# Patient Record
Sex: Female | Born: 1975
Health system: Southern US, Community
[De-identification: ages and names within clinical notes are randomized; demographics above are authoritative.]

## PROBLEM LIST (undated history)

## (undated) DIAGNOSIS — K589 Irritable bowel syndrome without diarrhea: Secondary | ICD-10-CM

## (undated) DIAGNOSIS — R87619 Unspecified abnormal cytological findings in specimens from cervix uteri: Secondary | ICD-10-CM

## (undated) DIAGNOSIS — G43009 Migraine without aura, not intractable, without status migrainosus: Secondary | ICD-10-CM

## (undated) DIAGNOSIS — I10 Essential (primary) hypertension: Secondary | ICD-10-CM

## (undated) DIAGNOSIS — K219 Gastro-esophageal reflux disease without esophagitis: Secondary | ICD-10-CM

## (undated) DIAGNOSIS — M519 Unspecified thoracic, thoracolumbar and lumbosacral intervertebral disc disorder: Secondary | ICD-10-CM

## (undated) DIAGNOSIS — B029 Zoster without complications: Secondary | ICD-10-CM

## (undated) DIAGNOSIS — E538 Deficiency of other specified B group vitamins: Secondary | ICD-10-CM

## (undated) HISTORY — DX: Deficiency of other specified B group vitamins: E53.8

## (undated) HISTORY — DX: Unspecified thoracic, thoracolumbar and lumbosacral intervertebral disc disorder: M51.9

## (undated) HISTORY — DX: Essential (primary) hypertension: I10

## (undated) HISTORY — DX: Unspecified abnormal cytological findings in specimens from cervix uteri: R87.619

## (undated) HISTORY — DX: Zoster without complications: B02.9

## (undated) HISTORY — DX: Migraine without aura, not intractable, without status migrainosus: G43.009

## (undated) HISTORY — DX: Irritable bowel syndrome, unspecified: K58.9

---

## 1999-05-10 ENCOUNTER — Encounter: Payer: Self-pay | Admitting: *Deleted

## 1999-05-10 ENCOUNTER — Ambulatory Visit (HOSPITAL_COMMUNITY): Admission: RE | Admit: 1999-05-10 | Discharge: 1999-05-10 | Payer: Self-pay | Admitting: *Deleted

## 2001-06-05 ENCOUNTER — Encounter: Payer: Self-pay | Admitting: *Deleted

## 2001-06-05 ENCOUNTER — Ambulatory Visit (HOSPITAL_COMMUNITY): Admission: RE | Admit: 2001-06-05 | Discharge: 2001-06-05 | Payer: Self-pay | Admitting: *Deleted

## 2002-07-29 ENCOUNTER — Ambulatory Visit (HOSPITAL_COMMUNITY): Admission: RE | Admit: 2002-07-29 | Discharge: 2002-07-29 | Payer: Self-pay | Admitting: *Deleted

## 2002-07-29 ENCOUNTER — Encounter: Payer: Self-pay | Admitting: *Deleted

## 2004-03-31 ENCOUNTER — Encounter: Admission: RE | Admit: 2004-03-31 | Discharge: 2004-05-03 | Payer: Self-pay | Admitting: Sports Medicine

## 2006-06-21 ENCOUNTER — Inpatient Hospital Stay (HOSPITAL_COMMUNITY): Admission: AD | Admit: 2006-06-21 | Discharge: 2006-06-24 | Payer: Self-pay | Admitting: Obstetrics & Gynecology

## 2008-04-28 ENCOUNTER — Ambulatory Visit (HOSPITAL_BASED_OUTPATIENT_CLINIC_OR_DEPARTMENT_OTHER): Admission: RE | Admit: 2008-04-28 | Discharge: 2008-04-28 | Payer: Self-pay | Admitting: Orthopedic Surgery

## 2008-04-28 HISTORY — PX: OSTEOTOMY AND ULNAR SHORTENING: SHX2140

## 2010-07-12 LAB — POCT HEMOGLOBIN-HEMACUE: Hemoglobin: 12.8 g/dL (ref 12.0–15.0)

## 2010-08-09 NOTE — Op Note (Signed)
NAMEKETINA, MARS NO.:  [id number]   MEDICAL RECORD NO.:  [id number]          PATIENT TYPE:  AMB   LOCATION:  DSC                          FACILITY:  [facility]   PHYSICIAN:  Katy Fitch. Sypher, M.D. DATE OF BIRTH:  April 03, 1975   DATE OF PROCEDURE:  04/28/2008  DATE OF DISCHARGE:                               OPERATIVE REPORT   PREOPERATIVE DIAGNOSIS:  Traumatic ulnocarpal abutment with an 80-month  history of left ulnar-sided wrist pain following a fall in March 2009  with plain films documenting an ulnar positive variant and an MRI  documenting extensive lunate edema and hyaline cartilage injuries on the  ulnar aspect of the lunate due to ulnocarpal abutment.   OPERATION:  1. Examination of left wrist under anesthesia.  2. Diagnostic arthroscopy of the left wrist followed by arthroscopic      debridement of lunate chondromalacia, triangular fibrocartilage      debris and synovectomy.  3. Precision ulnar shortening with a 2.5-mm bone wafer resection at      the diaphyseal-metaphyseal junction distally and placement of a 6-      hole 2.4 mm ASIF stainless steel locking plate system with hybrid      fixation with both gliding and locking screws.   SURGEON:  Katy Fitch. Sypher, MD   ASSISTANT:  Annye Rusk, PA-C   ANESTHESIA:  General by LMA.   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 Pati Gallo of [facility] for evaluation of a chronically painful left  wrist.  On June 03, 2007, she fell at home landing hard onto her  outstretched left hand.  Since that time, she has had ulnar-sided wrist  pain.  She has been evaluated by Dr. Samuel Jester followed by  evaluation by Michiel Sites and had an MR arthrogram of her wrist  documenting significant lunate edema, probable chondromalacia, no  evidence of a  significant triangular fibrocartilage tear and minimal  leak through the scapholunate ligament.   Ms. Weatherholtz was referred for an upper extremity orthopedic consult on  April 01, 2008 due to persistent ulnar-sided wrist pain.  Clinical  examination confirmed findings compatible with a 2-mm ulnar plus and  chronic ulnocarpal abutment.   We recommended a diagnostic arthroscopy anticipating a precision ulnar  shortening aiming for 0.5-mm ulnar minus.   After detailed informed consent both in the office and in the holding  area of the [facility], Ms. Selinger was brought to the operating  room at this time anticipating wrist arthroscopy, wrist debridement,  appropriate management of cartilage injury and ulnar shortening.   Preoperatively, she was advised of the potential risks and benefits  including infection, failure of her ulna to properly heal, need for  possible secondary surgery including bone grafting and a failure to  relieve all of her pain.  Questions were invited and answered in detail.   PROCEDURE:  Patric Vanpelt was brought to the operating room and placed in  a supine position on the operating table.   Dr. Jean Rosenthal had provided a detailed anesthesia informed consent in the  holding area and had placed an infraclavicular block leading to  excellent anesthesia of the left upper extremity.   Ms. Kates was brought to room 6, placed in supine position on the  operating table and under Dr. Edison Pace direct supervision, sedation  provided.  Ancef 1 gram was administered as an IV prophylactic  antibiotic followed by sedation.  The left arm was prepped with DuraPrep  and draped with impervious arthroscopy drapes.  A pair of nylon finger  traps were placed on the index and long fingers, countertraction on the  forearm and 10 pounds traction applied to the left wrist.  The scope was  introduced through the 3-4 dorsal portal after sounding with an 18-gauge  needle and distending  the wrist joint with blunt technique.  Diagnostic  arthroscopy revealed intact hyaline articular cartilage on the  scapholunate radial aspect, poor cartilage on the lunate ulnar aspect  and near normal cartilage on the triquetrum.  There was no frank central  degenerative or peripheral traumatic tear of the triangular  fibrocartilage noted.  There was synovitis dorsally and ulnarly.  There  was a patulous appearing scapholunate ligament that probably was a grade  2 injury by Geisler's grading system.  The LT ligament appeared intact.   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  A limited synovectomy was  accomplished for visualization.  The scope was then exchanged in the 6R  portal and the injury to the lunate documented.  Some synovitis in the  dorsal aspect of the wrist was removed with a suction shaver placed in  the 3-4 dorsal portal.   After confirmation of the ulnocarpal abutment predicament confirming the  findings noted on the MRI, we proceeded to the ulnar shortening.   The scope and tower were removed and the arm placed in a pronated  position on an arm table.  A 7-cm longitudinal incision was fashioned  directly over the ulna followed by careful identification and protection  of the dorsal ulnar sensory branches.  A transverse vein was suture  ligated followed by elevation of the extensor carpi ulnaris and exposure  of the periosteum of the ulna.  A six-hole locking 2.4-mm stainless  steel plate was selected and placed with two provisional screws distally  followed by a 2.5-mm precision wafer osteotomy at 45 degrees angle to  the axis of the ulna followed by placement of the plate, use of a  Verbrugge bone clamp to compress the osteotomy site and placement of a  gliding screw proximally to compress the osteoplasty site.  Two locking   screws were placed proximally and a compression lag screw was placed  obliquely across the osteoplasty.  An anatomic reconstruction was  achieved with an ulnar shortening of 2.5 mm.  A wrist image obtained at  90 degrees revealed an ulnar neutral variant at the conclusion of our  surgery.   The wound was then irrigated, bone graft placed around the osteoplasty  site and the skin repaired with subcutaneous suture of 4-0 Vicryl and  intradermal 3-0 Prolene segmental sutures.  Steri-Strips were applied  followed by infiltration of 2% lidocaine for postoperative analgesia.   There were no apparent complications.   Ms. Otter tolerated the surgery and  anesthesia well.  She was  transferred to the recovery room with stable vital signs.      Katy Fitch Sypher, M.D.  Electronically Signed     RVS/MEDQ  D:  04/28/2008  T:  04/29/2008  Job:  [id number]   cc:   Estell Harpin, M.D.  Samuel Jester

## 2011-04-13 ENCOUNTER — Other Ambulatory Visit: Payer: Self-pay | Admitting: Orthopedic Surgery

## 2011-04-13 ENCOUNTER — Encounter (HOSPITAL_BASED_OUTPATIENT_CLINIC_OR_DEPARTMENT_OTHER): Payer: Self-pay | Admitting: *Deleted

## 2011-04-20 ENCOUNTER — Encounter (HOSPITAL_BASED_OUTPATIENT_CLINIC_OR_DEPARTMENT_OTHER): Payer: Self-pay | Admitting: *Deleted

## 2011-04-20 ENCOUNTER — Ambulatory Visit (HOSPITAL_BASED_OUTPATIENT_CLINIC_OR_DEPARTMENT_OTHER): Payer: 59 | Admitting: *Deleted

## 2011-04-20 ENCOUNTER — Ambulatory Visit (HOSPITAL_BASED_OUTPATIENT_CLINIC_OR_DEPARTMENT_OTHER)
Admission: RE | Admit: 2011-04-20 | Discharge: 2011-04-20 | Disposition: A | Discharge status: Home / Self Care | Payer: 59 | Source: Ambulatory Visit | Attending: Orthopedic Surgery | Admitting: Orthopedic Surgery

## 2011-04-20 ENCOUNTER — Encounter (HOSPITAL_BASED_OUTPATIENT_CLINIC_OR_DEPARTMENT_OTHER)
Admission: RE | Disposition: A | Discharge status: Home / Self Care | Payer: Self-pay | Source: Ambulatory Visit | Attending: Orthopedic Surgery

## 2011-04-20 DIAGNOSIS — T8489XA Other specified complication of internal orthopedic prosthetic devices, implants and grafts, initial encounter: Secondary | ICD-10-CM | POA: Insufficient documentation

## 2011-04-20 DIAGNOSIS — M65839 Other synovitis and tenosynovitis, unspecified forearm: Secondary | ICD-10-CM | POA: Insufficient documentation

## 2011-04-20 DIAGNOSIS — Y831 Surgical operation with implant of artificial internal device as the cause of abnormal reaction of the patient, or of later complication, without mention of misadventure at the time of the procedure: Secondary | ICD-10-CM | POA: Insufficient documentation

## 2011-04-20 HISTORY — DX: Gastro-esophageal reflux disease without esophagitis: K21.9

## 2011-04-20 HISTORY — PX: HARDWARE REMOVAL: SHX979

## 2011-04-20 LAB — POCT HEMOGLOBIN-HEMACUE: Hemoglobin: 12.7 g/dL (ref 12.0–15.0)

## 2011-04-20 SURGERY — REMOVAL, HARDWARE
Anesthesia: General | Site: Arm Lower | Laterality: Left | Wound class: Clean

## 2011-04-20 MED ORDER — PROMETHAZINE HCL 25 MG/ML IJ SOLN: 6.2500 mg | INTRAMUSCULAR | Status: DC | PRN

## 2011-04-20 MED ORDER — LIDOCAINE HCL (CARDIAC) 20 MG/ML IV SOLN
INTRAVENOUS | Status: DC | PRN
  Administered 2011-04-20: 50 mg via INTRAVENOUS

## 2011-04-20 MED ORDER — CHLORHEXIDINE GLUCONATE 4 % EX LIQD: 60.0000 mL | Freq: Once | CUTANEOUS | Status: DC

## 2011-04-20 MED ORDER — MIDAZOLAM HCL 5 MG/5ML IJ SOLN
INTRAMUSCULAR | Status: DC | PRN
  Administered 2011-04-20: 2 mg via INTRAVENOUS

## 2011-04-20 MED ORDER — LIDOCAINE HCL 2 % IJ SOLN
INTRAMUSCULAR | Status: DC | PRN
  Administered 2011-04-20: 5.5 mL

## 2011-04-20 MED ORDER — HYDROMORPHONE HCL 2 MG PO TABS: ORAL_TABLET | ORAL | Status: AC

## 2011-04-20 MED ORDER — ONDANSETRON HCL 4 MG/2ML IJ SOLN
INTRAMUSCULAR | Status: DC | PRN
  Administered 2011-04-20: 4 mg via INTRAVENOUS

## 2011-04-20 MED ORDER — HYDROMORPHONE HCL 2 MG PO TABS
2.0000 mg | ORAL_TABLET | ORAL | Status: DC | PRN
  Administered 2011-04-20: 2 mg via ORAL

## 2011-04-20 MED ORDER — HYDROMORPHONE HCL PF 1 MG/ML IJ SOLN
0.2500 mg | INTRAMUSCULAR | Status: DC | PRN
  Administered 2011-04-20: 1 mg via INTRAVENOUS
  Administered 2011-04-20 (×2): 0.5 mg via INTRAVENOUS

## 2011-04-20 MED ORDER — DEXAMETHASONE SODIUM PHOSPHATE 4 MG/ML IJ SOLN
INTRAMUSCULAR | Status: DC | PRN
  Administered 2011-04-20: 10 mg via INTRAVENOUS

## 2011-04-20 MED ORDER — LACTATED RINGERS IV SOLN
INTRAVENOUS | Status: DC
  Administered 2011-04-20: 07:00:00 via INTRAVENOUS

## 2011-04-20 MED ORDER — PROPOFOL 10 MG/ML IV EMUL
INTRAVENOUS | Status: DC | PRN
  Administered 2011-04-20: 30 mg via INTRAVENOUS
  Administered 2011-04-20: 150 mg via INTRAVENOUS
  Administered 2011-04-20: 50 mg via INTRAVENOUS

## 2011-04-20 MED ORDER — CEPHALEXIN 500 MG PO CAPS: 500.0000 mg | ORAL_CAPSULE | Freq: Three times a day (TID) | ORAL | Status: AC

## 2011-04-20 MED ORDER — MEPERIDINE HCL 25 MG/ML IJ SOLN: 6.2500 mg | INTRAMUSCULAR | Status: DC | PRN

## 2011-04-20 MED ORDER — CEFAZOLIN SODIUM 1-5 GM-% IV SOLN
1.0000 g | Freq: Once | INTRAVENOUS | Status: AC
  Administered 2011-04-20: 1 g via INTRAVENOUS

## 2011-04-20 MED ORDER — FENTANYL CITRATE 0.05 MG/ML IJ SOLN
INTRAMUSCULAR | Status: DC | PRN
  Administered 2011-04-20: 100 ug via INTRAVENOUS
  Administered 2011-04-20: 50 ug via INTRAVENOUS

## 2011-04-20 SURGICAL SUPPLY — 46 items
BANDAGE ADHESIVE 1X3 (GAUZE/BANDAGES/DRESSINGS) IMPLANT
BANDAGE ELASTIC 3 VELCRO ST LF (GAUZE/BANDAGES/DRESSINGS) ×2 IMPLANT
BANDAGE GAUZE ELAST BULKY 4 IN (GAUZE/BANDAGES/DRESSINGS) ×2 IMPLANT
BLADE MINI RND TIP GREEN BEAV (BLADE) IMPLANT
BLADE SURG 15 STRL LF DISP TIS (BLADE) ×1 IMPLANT
BLADE SURG 15 STRL SS (BLADE) ×2
BNDG CMPR 9X4 STRL LF SNTH (GAUZE/BANDAGES/DRESSINGS) ×1
BNDG ESMARK 4X9 LF (GAUZE/BANDAGES/DRESSINGS) ×2 IMPLANT
BRUSH SCRUB EZ PLAIN DRY (MISCELLANEOUS) ×2 IMPLANT
CLOTH BEACON ORANGE TIMEOUT ST (SAFETY) ×2 IMPLANT
CORDS BIPOLAR (ELECTRODE) ×2 IMPLANT
COVER MAYO STAND STRL (DRAPES) ×2 IMPLANT
COVER TABLE BACK 60X90 (DRAPES) ×2 IMPLANT
CUFF TOURNIQUET SINGLE 18IN (TOURNIQUET CUFF) ×2 IMPLANT
DECANTER SPIKE VIAL GLASS SM (MISCELLANEOUS) ×1 IMPLANT
DRAPE EXTREMITY T 121X128X90 (DRAPE) ×2 IMPLANT
DRAPE OEC MINIVIEW 54X84 (DRAPES) IMPLANT
DRAPE SURG 17X23 STRL (DRAPES) ×2 IMPLANT
GAUZE XEROFORM 1X8 LF (GAUZE/BANDAGES/DRESSINGS) IMPLANT
GLOVE BIO SURGEON STRL SZ 6.5 (GLOVE) ×1 IMPLANT
GLOVE BIOGEL M STRL SZ7.5 (GLOVE) ×2 IMPLANT
GLOVE BIOGEL PI IND STRL 7.0 (GLOVE) IMPLANT
GLOVE BIOGEL PI INDICATOR 7.0 (GLOVE) ×2
GLOVE ORTHO TXT STRL SZ7.5 (GLOVE) ×2 IMPLANT
GOWN PREVENTION PLUS XLARGE (GOWN DISPOSABLE) ×2 IMPLANT
GOWN PREVENTION PLUS XXLARGE (GOWN DISPOSABLE) ×4 IMPLANT
NEEDLE 27GAX1X1/2 (NEEDLE) IMPLANT
NS IRRIG 1000ML POUR BTL (IV SOLUTION) ×2 IMPLANT
PACK BASIN DAY SURGERY FS (CUSTOM PROCEDURE TRAY) ×2 IMPLANT
PADDING CAST ABS 4INX4YD NS (CAST SUPPLIES) ×1
PADDING CAST ABS COTTON 4X4 ST (CAST SUPPLIES) ×1 IMPLANT
SPLINT PLASTER CAST XFAST 3X15 (CAST SUPPLIES) IMPLANT
SPLINT PLASTER XTRA FASTSET 3X (CAST SUPPLIES) ×8
SPONGE GAUZE 4X4 12PLY (GAUZE/BANDAGES/DRESSINGS) ×2 IMPLANT
STOCKINETTE 4X48 STRL (DRAPES) ×2 IMPLANT
STRIP CLOSURE SKIN 1/2X4 (GAUZE/BANDAGES/DRESSINGS) ×2 IMPLANT
SUT PROLENE 3 0 PS 2 (SUTURE) ×2 IMPLANT
SUT VIC AB 3-0 PS1 18 (SUTURE) ×2
SUT VIC AB 3-0 PS1 18XBRD (SUTURE) ×1 IMPLANT
SYR 3ML 23GX1 SAFETY (SYRINGE) IMPLANT
SYR BULB 3OZ (MISCELLANEOUS) ×2 IMPLANT
SYR CONTROL 10ML LL (SYRINGE) IMPLANT
TOWEL OR 17X24 6PK STRL BLUE (TOWEL DISPOSABLE) ×2 IMPLANT
TRAY DSU PREP LF (CUSTOM PROCEDURE TRAY) ×2 IMPLANT
UNDERPAD 30X30 INCONTINENT (UNDERPADS AND DIAPERS) ×2 IMPLANT
WATER STERILE IRR 1000ML POUR (IV SOLUTION) ×2 IMPLANT

## 2011-04-20 NOTE — Op Note (Signed)
Op note dictated: 04/20/11  [id number]

## 2011-04-20 NOTE — Brief Op Note (Signed)
04/20/2011  8:29 AM  PATIENT:  Katelyn Welch  36 y.o. female  PRE-OPERATIVE DIAGNOSIS:  status post ulna shortening on left with retained metal  POST-OPERATIVE DIAGNOSIS:  same as preop  PROCEDURE:  Procedure(s): HARDWARE REMOVAL ASIF 2.4 MM PLATE AND 6 SCREWS  SURGEON:  Surgeon(s): [name]., MD  PHYSICIAN ASSISTANT:   ASSISTANTS: Mallory Shirk.A-C   ANESTHESIA:   general  EBL:     BLOOD ADMINISTERED:none  DRAINS: none   LOCAL MEDICATIONS USED:  LIDOCAINE 4CC  2%  SPECIMEN:  No Specimen  DISPOSITION OF SPECIMEN:  N/A  COUNTS:  YES  TOURNIQUET:  * Missing tourniquet times found for documented tourniquets in log:  [id number] *  DICTATION: .Other Dictation: Dictation Number [id number]  PLAN OF CARE: Discharge to home after PACU  PATIENT DISPOSITION:  PACU - hemodynamically stable.

## 2011-04-20 NOTE — Op Note (Signed)
NAMEJASIE, Katelyn Welch NO.:  [id number]  MEDICAL RECORD NO.:  [id number]  LOCATION:                                 FACILITY:  PHYSICIAN:  Katelyn Fitch. Deo Mehringer, M.D.      DATE OF BIRTH:  DATE OF PROCEDURE:  04/20/2011 DATE OF DISCHARGE:                              OPERATIVE REPORT   PREOPERATIVE DIAGNOSIS:  Retained 2.4-mm ASIF plate and fix screws, status post prior left ulnar shortening in February 2010, with development of tenosynovitis involving extensor carpi ulnaris and sixth dorsal compartment at distal margin of plate.  POSTOPERATIVE DIAGNOSIS:  Retained 2.4-mm ASIF plate and fix screws, status post prior left ulnar shortening in February 2010, with development of tenosynovitis involving extensor carpi ulnaris and sixth dorsal compartment at distal margin of plate.  OPERATING SURGEON:  Katelyn Fitch. Aarya Robinson, MD  ASSISTANT:  Marveen Reeks Dasnoit, PA-C  ANESTHESIA:  General by LMA.  SUPERVISING ANESTHESIOLOGIST:  Zenon Mayo, MD  INDICATIONS:  Katelyn Welch is a 36 year old woman who had previously been treated for ulnocarpal abutment with a ulnar shortening in February 2010.  She did very well following surgery.  At approximately 2-1/2 years following her surgery, she began to experience some ulnar-sided forearm pain.  She noted crackling with wrist flexion, extension and ulnar deviation.  She returned to our office to discuss this.  We had foreshadowed, but this could occur.  She was indeed noted to have signs of tenosynovitis. Therefore, we recommended plate removal.  Preoperative x-ray revealed that her ulna was well healed.  She was counseled preoperatively that she will need to protect her forearm for 6 weeks postoperatively due to the screw holes that will be potential stress risers.  Questions regarding the anticipated procedure invited and answered in detail.  PROCEDURE:  Katelyn Welch was brought to room #2 of the [facility]  and placed in supine position on the operating table.  Following the induction of general anesthesia by LMA technique under Dr. Jarrett Ables direct supervision, the left arm was prepped with Betadine soap solution and sterilely draped.  Ancef 1 g was administered as an IV prophylactic antibiotic.  The arm was exsanguinated with an Esmarch bandage and proximal brachium was inflated to 250 mmHg.  A routine surgical time-out was accomplished.  Katelyn Welch does not have any drug allergies.  Procedure commenced with resection of the prior surgical scar. Subcutaneous tissues were carefully divided taking care to identify and gently protect the dorsal ulnar sensory branches.  The plate was identified by palpation.  There was a tight fascial covering over the plate causing an indentation of the extensor carpi ulnaris at the distal margin of the plate and at the most distal screw.  This was released followed by meticulous elevation of soft tissues off the plate.  Care was taken to preserve the blood supply to the extensor carpi ulnaris. The plate was cleared of soft tissues and a Hex Drive screwdriver was used to sequentially remove the locking and gliding screws.  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  The wound was then inspected for bleeding  points followed by repair of the skin with subcutaneous suture of 4-0 Vicryl and intradermal 3-0 Prolene with Steri-Strips.  Lidocaine was infiltrated for postoperative analgesia followed by placement of a compressive dressing with a volar forearm splint.  There were no apparent complications.  For aftercare, Katelyn Welch is provided prescriptions for Keflex 500 mg 1 p.o. q.8 hours x3 days as a prophylactic antibiotic, also Dilaudid 2 mg 1 p.o. q.4-6 hours p.r.n. pain, 30 tablets without refill.  We will see her back for followup in 7-10 days for wound assessment, suture removal, and initiation of a  splinting program to protect her ulna following plate and screw removal.  Questions were invited and answered in detail.     Katelyn Welch, M.D.     RVS/MEDQ  D:  04/20/2011  T:  04/20/2011  Job:  [id number]  cc:   Ernestina Penna, M.D.

## 2011-04-20 NOTE — Transfer of Care (Signed)
Immediate Anesthesia Transfer of Care Note  Patient: Katelyn Welch  Procedure(s) Performed:  HARDWARE REMOVAL - removal of plate and screws left ulna  Patient Location: PACU  Anesthesia Type: General  Level of Consciousness: awake, oriented and patient cooperative  Airway & Oxygen Therapy: Patient Spontanous Breathing and Patient connected to face mask oxygen  Post-op Assessment: Report given to PACU RN, Post -op Vital signs reviewed and stable and Patient moving all extremities  Post vital signs: Reviewed and stable  Complications: No apparent anesthesia complications

## 2011-04-20 NOTE — H&P (Signed)
  Katelyn Welch is an 36 y.o. female.   Chief Complaint: Complaining of pain ulnar border left HPI: Patient is a 36 year old right-hand-dominant female who is status post left wrist arthroscopy with ulnar shortening several years ago she has a retained plate and screws on the ulnar from her ulnar shortening she has been recently working out in the gym and the arm has become painful in the area of the plate. She wishes to proceed with plate and screw removal.  Past Medical History  Diagnosis Date  . GERD (gastroesophageal reflux disease)     OTC as needed    Past Surgical History  Procedure Date  . Osteotomy and ulnar shortening 04/28/2008    left    History reviewed. No pertinent family history. Social History:  reports that she quit smoking about 2 weeks ago. She has never used smokeless tobacco. She reports that she drinks alcohol. She reports that she does not use illicit drugs.  Allergies: No Known Allergies  Medications Prior to Admission  Medication Dose Route Frequency Provider Last Rate Last Dose  . chlorhexidine (HIBICLENS) 4 % liquid 4 application  60 mL Topical Once       . lactated ringers infusion   Intravenous Continuous Constance Goltz, MD 20 mL/hr at 04/20/11 0654     No current outpatient prescriptions on file as of 04/20/2011.    Results for orders placed during the hospital encounter of 04/20/11 (from the past 48 hour(s))  POCT HEMOGLOBIN-HEMACUE     Status: Normal   Collection Time   04/20/11  7:04 AM      Component Value Range Comment   Hemoglobin 12.7  12.0 - 15.0 (g/dL)     No results found.   Pertinent items are noted in HPI.  Blood pressure 105/71, pulse 71, temperature 98 F (36.7 C), temperature source Oral, resp. rate 16, height 5\' 4"  (1.626 m), weight 68.04 kg (150 lb), last menstrual period 03/27/2011, SpO2 99.00%.  General appearance: alert Head: Normocephalic, without obvious abnormality Neck: supple, symmetrical, trachea  midline Resp: clear to auscultation bilaterally Cardio: regular rate and rhythm, S1, S2 normal, no murmur, click, rub or gallop GI: normal findings: bowel sounds normal Extremities: Examination of the left wrist reveals a very well-healed incision along the ulnar border of the distal forearm her plate is palpable she does have crepitance in the area of the plate with wrist flexion extension neurovascular she is intact she has excellent motion of her fingers and wrist. Pulses: 2+ and symmetric Skin: normal Neurologic: Grossly normal    Assessment Impression: Tenosynovitis left distal wrist status post ulnar shortening with plate and screws.  Plan: Patient to be taken to the operating room to undergo removal of plate and screws left wrist. The procedure risks benefits and postoperative course were discussed with the patient at length and she was in agreement with this plan.  DASNOIT,Katelyn Welch 04/20/2011, 7:19 AM    H&P documentation: 04/20/2011  -History and Physical Reviewed  -Patient has been re-examined  -No change in the plan of care  Katelyn Forster, MD

## 2011-04-20 NOTE — Anesthesia Postprocedure Evaluation (Signed)
  Anesthesia Post-op Note  Patient: Katelyn Welch  Procedure(s) Performed:  HARDWARE REMOVAL - removal of plate and screws left ulna  Patient Location: PACU  Anesthesia Type: General  Level of Consciousness: awake and alert   Airway and Oxygen Therapy: Patient Spontanous Breathing and Patient connected to face mask oxygen  Post-op Pain: mild  Post-op Assessment: Post-op Vital signs reviewed, Patient's Cardiovascular Status Stable, Respiratory Function Stable, Patent Airway and No signs of Nausea or vomiting  Post-op Vital Signs: Reviewed and stable  Complications: No apparent anesthesia complications

## 2011-04-20 NOTE — Anesthesia Procedure Notes (Signed)
Procedure Name: LMA Insertion Date/Time: 04/20/2011 7:53 AM Performed by: Meyer Russel Pre-anesthesia Checklist: Patient identified, Emergency Drugs available, Suction available, Patient being monitored and Timeout performed Patient Re-evaluated:Patient Re-evaluated prior to inductionOxygen Delivery Method: Circle System Utilized Preoxygenation: Pre-oxygenation with 100% oxygen Intubation Type: IV induction Ventilation: Mask ventilation without difficulty LMA: LMA inserted LMA Size: 4.0 Number of attempts: 1 Airway Equipment and Method: bite block Placement Confirmation: positive ETCO2 and breath sounds checked- equal and bilateral Tube secured with: Tape Dental Injury: Teeth and Oropharynx as per pre-operative assessment

## 2011-04-20 NOTE — Anesthesia Preprocedure Evaluation (Addendum)
Anesthesia Evaluation Anesthesia Physical Anesthesia Plan Anesthesia Quick Evaluation  

## 2011-04-21 ENCOUNTER — Encounter: Payer: Self-pay | Admitting: *Deleted

## 2012-04-12 ENCOUNTER — Encounter: Payer: Self-pay | Admitting: *Deleted

## 2012-05-17 ENCOUNTER — Ambulatory Visit (INDEPENDENT_AMBULATORY_CARE_PROVIDER_SITE_OTHER): Payer: 59 | Admitting: Internal Medicine

## 2012-05-17 ENCOUNTER — Other Ambulatory Visit (INDEPENDENT_AMBULATORY_CARE_PROVIDER_SITE_OTHER): Payer: 59

## 2012-05-17 ENCOUNTER — Encounter: Payer: Self-pay | Admitting: Internal Medicine

## 2012-05-17 VITALS — BP 100/68 | HR 84 | Ht 64.0 in | Wt 152.8 lb

## 2012-05-17 DIAGNOSIS — R197 Diarrhea, unspecified: Secondary | ICD-10-CM

## 2012-05-17 LAB — COMPREHENSIVE METABOLIC PANEL
ALT: 29 U/L (ref 0–35)
Albumin: 4.5 g/dL (ref 3.5–5.2)
Alkaline Phosphatase: 81 U/L (ref 39–117)
CO2: 26 mEq/L (ref 19–32)
GFR: 128.82 mL/min (ref >60.00)
Glucose, Bld: 91 mg/dL (ref 70–99)
Potassium: 4.1 mEq/L (ref 3.5–5.1)
Sodium: 137 mEq/L (ref 135–145)
Total Bilirubin: 0.4 mg/dL (ref 0.3–1.2)
Total Protein: 7.6 g/dL (ref 6.0–8.3)

## 2012-05-17 LAB — CBC WITH DIFFERENTIAL/PLATELET
Basophils Absolute: 0 10*3/uL (ref 0.0–0.1)
Eosinophils Relative: 4 % (ref 0.0–5.0)
HCT: 36.7 % (ref 36.0–46.0)
Lymphs Abs: 2.2 10*3/uL (ref 0.7–4.0)
MCV: 93.7 fl (ref 78.0–100.0)
Monocytes Absolute: 0.7 10*3/uL (ref 0.1–1.0)
Neutrophils Relative %: 61.5 % (ref 43.0–77.0)
Platelets: 301 10*3/uL (ref 150.0–400.0)
RDW: 13 % (ref 11.5–14.6)
WBC: 8.8 10*3/uL (ref 4.5–10.5)

## 2012-05-17 LAB — SEDIMENTATION RATE: Sed Rate: 18 mm/hr (ref 0–22)

## 2012-05-17 LAB — TSH: TSH: 0.8 u[IU]/mL (ref 0.35–5.50)

## 2012-05-17 MED ORDER — MOVIPREP 100 G PO SOLR: 1.0000 | Freq: Once | ORAL | Status: DC

## 2012-05-17 MED ORDER — DICYCLOMINE HCL 10 MG PO CAPS: ORAL_CAPSULE | ORAL | Status: DC

## 2012-05-17 NOTE — Patient Instructions (Addendum)
You have been given a separate informational sheet regarding your tobacco use, the importance of quitting and local resources to help you quit.  You have been scheduled for a colonoscopy with propofol. Please follow written instructions given to you at your visit today.  Please pick up your prep kit at the pharmacy within the next 1-3 days. If you use inhalers (even only as needed) or a CPAP machine, please bring them with you on the day of your procedure.  We have sent the following medications to your pharmacy for you to pick up at your convenience: Bentyl  Your physician has requested that you go to the basement for the following lab work before leaving today: Celiac 10 Panel, TSH, Sed Rate, CMET, CBC  CC: Dr Samuel Jester

## 2012-05-17 NOTE — Progress Notes (Signed)
Katelyn Welch 1975-04-05 MRN [id number]   History of Present Illness:  This is a 37 year old white female with urgent diarrhea which occurs mostly in the mornings, up to 6 bowel movements in the mornings and sometimes postprandially during the day and after supper. She has had several accidents. This has been going on for about 3-4 months. She has not lost any weight and she has not seen any blood in her stools. The diarrhea does not occur at night. She has intermittent episodes of constipation. She has a stressful job. Her diet is high in fat and carbohydrates. There is a positive family history of gastrointestinal cancer in her mother. She was diagnosed with interstitial cystitis. She is on chronic narcotics for arthritic pains and shoulder pain and is taking Percocet 3-4 times a day.   Past Medical History  Diagnosis Date  . GERD (gastroesophageal reflux disease)     OTC as needed  . Other and unspecified disc disorder of lumbar region   . IBS (irritable bowel syndrome)   . B12 deficiency   . Shingles   . Migraine headache   . Interstitial cystitis    Past Surgical History  Procedure Laterality Date  . Osteotomy and ulnar shortening  04/28/2008    left  . Hardware removal  04/20/2011    Procedure: HARDWARE REMOVAL;  Surgeon: [name]., MD;  Location: [facility];  Service: Orthopedics;  Laterality: Left;  removal of plate and screws left ulna    reports that she has been smoking.  She has never used smokeless tobacco. She reports that she does not drink alcohol or use illicit drugs. family history includes Diabetes in her father; Irritable bowel syndrome in her sister; Obesity in her father; and Stomach cancer in her mother. Allergies  Allergen Reactions  . Antihistamines, Diphenhydramine-Type         Review of Systems: Negative for heartburn dysphagia chest pain  The remainder of the 10 point ROS is negative except as outlined in H&P   Physical  Exam: General appearance  Well developed, in no distress. Eyes- non icteric. HEENT nontraumatic, normocephalic. Mouth no lesions, tongue papillated, no cheilosis. Neck supple without adenopathy, thyroid not enlarged, no carotid bruits, no JVD. Lungs Clear to auscultation bilaterally. Cor normal S1, normal S2, regular rhythm, no murmur,  quiet precordium. Abdomen: Soft nontender abdomen with minimal discomfort in left and right lower quadrants. No distention. Normal active bowel sounds. Liver edge at costal margin. Rectal: Normal rectal sphincter tone. Small amount of soft Hemoccult negative stool. Extremities no pedal edema. Skin no lesions. Neurological alert and oriented x 3. Psychological normal mood and affect.  Assessment and Plan:  Problem #34 37 year old white female with urgent diarrheal stools which occur mostly in the mornings and are  consistent with an  irritable bowel syndrome. She has been quite incapacitated with this problem. We have discussed dietary modification of high-fiber, low-fat  diet. We will start on Bentyl 20 mg at bedtime and 10 mg in the morning. I suggested increasing the fiber intake. We will check a sprue profile, sedimentation rate, TSH, metabolic panel and CBC. We need to rule out inflammatory bowel disease or microscopic colitis. We will proceed with a colonoscopy and appropriate biopsies.to r/o microscopic colitis. We have discussed the procedure, prep and the sedation with her.   05/17/2012 Lina Sar

## 2012-05-20 ENCOUNTER — Encounter: Payer: Self-pay | Admitting: Internal Medicine

## 2012-05-20 LAB — CELIAC PANEL 10: IgA: 176 mg/dL (ref 69–380)

## 2012-06-05 ENCOUNTER — Encounter: Payer: Self-pay | Admitting: Internal Medicine

## 2012-06-05 ENCOUNTER — Ambulatory Visit (AMBULATORY_SURGERY_CENTER): Payer: 59 | Admitting: Internal Medicine

## 2012-06-05 VITALS — BP 136/75 | HR 73 | Temp 98.1°F | Resp 27 | Ht 64.0 in | Wt 152.0 lb

## 2012-06-05 DIAGNOSIS — D126 Benign neoplasm of colon, unspecified: Secondary | ICD-10-CM

## 2012-06-05 MED ORDER — SODIUM CHLORIDE 0.9 % IV SOLN: 500.0000 mL | INTRAVENOUS | Status: DC

## 2012-06-05 NOTE — Progress Notes (Signed)
Called to room to assist during endoscopic procedure.  Patient ID and intended procedure confirmed with present staff. Received instructions for my participation in the procedure from the performing physician.  

## 2012-06-05 NOTE — Progress Notes (Signed)
Patient did not experience any of the following events: a burn prior to discharge; a fall within the facility; wrong site/side/patient/procedure/implant event; or a hospital transfer or hospital admission upon discharge from the facility. (G8907) Patient did not have preoperative order for IV antibiotic SSI prophylaxis. (G8918)  

## 2012-06-05 NOTE — Patient Instructions (Addendum)
YOU HAD AN ENDOSCOPIC PROCEDURE TODAY AT THE [facility]: Refer to the procedure report that was given to you for any specific questions about what was found during the examination.  If the procedure report does not answer your questions, please call your gastroenterologist to clarify.  If you requested that your care partner not be given the details of your procedure findings, then the procedure report has been included in a sealed envelope for you to review at your convenience later.  YOU SHOULD EXPECT: Some feelings of bloating in the abdomen. Passage of more gas than usual.  Walking can help get rid of the air that was put into your GI tract during the procedure and reduce the bloating. If you had a lower endoscopy (such as a colonoscopy or flexible sigmoidoscopy) you may notice spotting of blood in your stool or on the toilet paper. If you underwent a bowel prep for your procedure, then you may not have a normal bowel movement for a few days.  DIET: Your first meal following the procedure should be a light meal and then it is ok to progress to your normal diet.  A half-sandwich or bowl of soup is an example of a good first meal.  Heavy or fried foods are harder to digest and may make you feel nauseous or bloated.  Likewise meals heavy in dairy and vegetables can cause extra gas to form and this can also increase the bloating.  Drink plenty of fluids but you should avoid alcoholic beverages for 24 hours.  ACTIVITY: Your care partner should take you home directly after the procedure.  You should plan to take it easy, moving slowly for the rest of the day.  You can resume normal activity the day after the procedure however you should NOT DRIVE or use heavy machinery for 24 hours (because of the sedation medicines used during the test).    SYMPTOMS TO REPORT IMMEDIATELY: A gastroenterologist can be reached at any hour.  During normal business hours, 8:30 AM to 5:00 PM Monday through Friday,  call [phone].  After hours and on weekends, please call the GI answering service at [phone] who will take a message and have the physician on call contact you.   Following lower endoscopy (colonoscopy or flexible sigmoidoscopy):  Excessive amounts of blood in the stool  Significant tenderness or worsening of abdominal pains  Swelling of the abdomen that is new, acute  Fever of 100F or highe     FOLLOW UP: If any biopsies were taken you will be contacted by phone or by letter within the next 1-3 weeks.  Call your gastroenterologist if you have not heard about the biopsies in 3 weeks.  Our staff will call the home number listed on your records the next business day following your procedure to check on you and address any questions or concerns that you may have at that time regarding the information given to you following your procedure. This is a courtesy call and so if there is no answer at the home number and we have not heard from you through the emergency physician on call, we will assume that you have returned to your regular daily activities without incident.  SIGNATURES/CONFIDENTIALITY: You and/or your care partner have signed paperwork which will be entered into your electronic medical record.  These signatures attest to the fact that that the information above on your After Visit Summary has been reviewed and is understood.  Full responsibility of the  confidentiality of this discharge information lies with you and/or your care-partner.  Normal colonoscopy.  High fiber diet information given.  Continue Bentyl 20 mg twice daily.  Next colonoscopy at 49 years.

## 2012-06-05 NOTE — Progress Notes (Signed)
Pt stable to RR 

## 2012-06-05 NOTE — Op Note (Signed)
[facility] [address] [place], [address]   COLONOSCOPY PROCEDURE REPORT  PATIENT: Katelyn Welch, Katelyn Welch  MR#: [id number] BIRTHDATE: Apr 16, 1975 , 36  yrs. old GENDER: Female ENDOSCOPIST: Hart Carwin, MD REFERRED BY:  Samuel Jester PROCEDURE DATE:  06/05/2012 PROCEDURE:   Colonoscopy with biopsy ASA CLASS:   Class I INDICATIONS:Unexplained diarrhea. MEDICATIONS: MAC sedation, administered by CRNA and propofol (Diprivan) 350mg  IV  DESCRIPTION OF PROCEDURE:   After the risks and benefits and of the procedure were explained, informed consent was obtained.  A digital rectal exam revealed no abnormalities of the rectum.    The LB [id number]-[id number]  endoscope was introduced through the anus and advanced to the cecum, which was identified by both the appendix and ileocecal valve .  The quality of the prep was good, using MoviPrep .  The instrument was then slowly withdrawn as the colon was fully examined.     COLON FINDINGS: A normal appearing cecum, ileocecal valve, and appendiceal orifice were identified.  The ascending, hepatic flexure, transverse, splenic flexure, descending, sigmoid colon and rectum appeared unremarkable.  No polyps or cancers were seen. Multiple random biopsies of the area were performed. Retroflexed views revealed no abnormalities.     The scope was then withdrawn from the patient and the procedure completed.  COMPLICATIONS: There were no complications. ENDOSCOPIC IMPRESSION: Normal colon; multiple random biopsies of the area were performed to r/o microscopic colitis  RECOMMENDATIONS: Await biopsy results high fiber diet continue Bentyl 20 mg po bid minimize narcotic medications  suspect IBS/diarrhea Recall colonoscopy age 36  REPEAT EXAM:  cc:  _______________________________ eSignedHart Carwin, MD 06/05/2012 3:53 PM     PATIENT NAME:  [name], [name] MR#: [id number]

## 2012-06-06 ENCOUNTER — Telehealth: Payer: Self-pay | Admitting: *Deleted

## 2012-06-06 NOTE — Telephone Encounter (Signed)
  Follow up Call-  Call back number 06/05/2012  Post procedure Call Back phone  # [phone]  Permission to leave phone message Yes     Patient questions:  Do you have a fever, pain , or abdominal swelling? no Pain Score  0 *  Have you tolerated food without any problems? yes  Have you been able to return to your normal activities? yes  Do you have any questions about your discharge instructions: Diet   no Medications  no Follow up visit  no  Do you have questions or concerns about your Care? no  Actions: * If pain score is 4 or above: No action needed, pain <4.

## 2012-06-11 ENCOUNTER — Encounter: Payer: Self-pay | Admitting: Internal Medicine

## 2012-07-09 ENCOUNTER — Ambulatory Visit (INDEPENDENT_AMBULATORY_CARE_PROVIDER_SITE_OTHER): Payer: 59 | Admitting: Internal Medicine

## 2012-07-09 ENCOUNTER — Encounter: Payer: Self-pay | Admitting: Internal Medicine

## 2012-07-09 VITALS — BP 120/70 | HR 80 | Ht 64.0 in | Wt 146.0 lb

## 2012-07-09 DIAGNOSIS — K589 Irritable bowel syndrome without diarrhea: Secondary | ICD-10-CM

## 2012-07-09 MED ORDER — SERTRALINE HCL 50 MG PO TABS: 50.0000 mg | ORAL_TABLET | Freq: Every day | ORAL | Status: DC

## 2012-07-09 NOTE — Progress Notes (Signed)
Katelyn Welch 1975/07/05 MRN [id number]  History of Present Illness:  This is a 37 year old white female with severe diarrhea predominant irritable bowel syndrome evaluated in February 2013.Her sprue profile was negative.. A colonoscopy in March 2014 was normal and random biopsies of the colon showed no evidence for microscopic colitis. Her TSH was normal as well as her sedimentation rate . S he was started on Bentyl 20 mg at bedtime and 10 mg in the morning and an increased fiber. She soon became constipated so she reduced Bentyl and then had several episodes of diarrhea. She is definitely improved but still hasn't figured out the right dose of Bentyl. She is extremely tired because she works full time and also takes care of her young children. She feels stressed out.   Past Medical History  Diagnosis Date  . GERD (gastroesophageal reflux disease)     OTC as needed  . Other and unspecified disc disorder of lumbar region   . IBS (irritable bowel syndrome)   . B12 deficiency   . Shingles   . Migraine headache   . Interstitial cystitis    Past Surgical History  Procedure Laterality Date  . Osteotomy and ulnar shortening  04/28/2008    left  . Hardware removal  04/20/2011    Procedure: HARDWARE REMOVAL;  Surgeon: [name]., MD;  Location: [facility];  Service: Orthopedics;  Laterality: Left;  removal of plate and screws left ulna    reports that she has been smoking.  She has never used smokeless tobacco. She reports that she does not drink alcohol or use illicit drugs. family history includes Diabetes in her father; Irritable bowel syndrome in her sister; Obesity in her father; and Stomach cancer in her mother. Allergies  Allergen Reactions  . Antihistamines, Diphenhydramine-Type         Review of Systems: Denies nausea or vomiting  The remainder of the 10 point ROS is negative except as outlined in H&P   Assessment and Plan:  Problem #20 37 year old white  female with irritable bowel syndrome. She is improved on dicyclomine. She is also on supplemental fiber and a high-fiber diet. We have discussed stress management and she would appreciate starting  an SSRI which would help manage her IBS. We will start her on Zoloft 50 mg daily for 2 weeks and she may increase it to 1-1/2 tablets a day afterwards. I would like to see her in approximately 2 months to discuss further disposition.   07/09/2012 Lina Sar

## 2012-07-09 NOTE — Patient Instructions (Addendum)
We have sent the following medications to your pharmacy for you to pick up at your convenience: Zoloft 50 mg-Take 1 tablet by mouth each night. After taking 50 mg x 2-3 weeks, please increase dosage to 75 mg daily (1.5 tablets).  Please follow up with Dr Juanda Chance in 2 months.  CC: Dr Samuel Jester

## 2012-07-15 ENCOUNTER — Encounter: Payer: Self-pay | Admitting: *Deleted

## 2012-07-16 ENCOUNTER — Other Ambulatory Visit: Payer: Self-pay | Admitting: Internal Medicine

## 2012-08-17 ENCOUNTER — Other Ambulatory Visit: Payer: Self-pay | Admitting: Internal Medicine

## 2012-08-28 ENCOUNTER — Ambulatory Visit: Payer: 59 | Admitting: Internal Medicine

## 2013-01-30 ENCOUNTER — Other Ambulatory Visit: Payer: Self-pay

## 2013-04-14 ENCOUNTER — Encounter (HOSPITAL_COMMUNITY): Payer: Self-pay | Admitting: Emergency Medicine

## 2013-04-14 ENCOUNTER — Emergency Department (HOSPITAL_COMMUNITY)
Admission: EM | Admit: 2013-04-14 | Discharge: 2013-04-14 | Disposition: A | Discharge status: Home / Self Care | Payer: 59 | Attending: Emergency Medicine | Admitting: Emergency Medicine

## 2013-04-14 ENCOUNTER — Emergency Department (HOSPITAL_COMMUNITY): Payer: 59

## 2013-04-14 DIAGNOSIS — Z8669 Personal history of other diseases of the nervous system and sense organs: Secondary | ICD-10-CM | POA: Insufficient documentation

## 2013-04-14 DIAGNOSIS — Z87440 Personal history of urinary (tract) infections: Secondary | ICD-10-CM | POA: Insufficient documentation

## 2013-04-14 DIAGNOSIS — Y92009 Unspecified place in unspecified non-institutional (private) residence as the place of occurrence of the external cause: Secondary | ICD-10-CM | POA: Insufficient documentation

## 2013-04-14 DIAGNOSIS — Y9301 Activity, walking, marching and hiking: Secondary | ICD-10-CM | POA: Insufficient documentation

## 2013-04-14 DIAGNOSIS — E538 Deficiency of other specified B group vitamins: Secondary | ICD-10-CM | POA: Insufficient documentation

## 2013-04-14 DIAGNOSIS — M519 Unspecified thoracic, thoracolumbar and lumbosacral intervertebral disc disorder: Secondary | ICD-10-CM | POA: Insufficient documentation

## 2013-04-14 DIAGNOSIS — K589 Irritable bowel syndrome without diarrhea: Secondary | ICD-10-CM | POA: Insufficient documentation

## 2013-04-14 DIAGNOSIS — Z79899 Other long term (current) drug therapy: Secondary | ICD-10-CM | POA: Insufficient documentation

## 2013-04-14 DIAGNOSIS — F17200 Nicotine dependence, unspecified, uncomplicated: Secondary | ICD-10-CM | POA: Insufficient documentation

## 2013-04-14 DIAGNOSIS — Z8619 Personal history of other infectious and parasitic diseases: Secondary | ICD-10-CM | POA: Insufficient documentation

## 2013-04-14 DIAGNOSIS — S9030XA Contusion of unspecified foot, initial encounter: Secondary | ICD-10-CM | POA: Insufficient documentation

## 2013-04-14 DIAGNOSIS — X500XXA Overexertion from strenuous movement or load, initial encounter: Secondary | ICD-10-CM | POA: Insufficient documentation

## 2013-04-14 DIAGNOSIS — S9031XA Contusion of right foot, initial encounter: Secondary | ICD-10-CM

## 2013-04-14 DIAGNOSIS — K219 Gastro-esophageal reflux disease without esophagitis: Secondary | ICD-10-CM | POA: Insufficient documentation

## 2013-04-14 DIAGNOSIS — Z888 Allergy status to other drugs, medicaments and biological substances status: Secondary | ICD-10-CM | POA: Insufficient documentation

## 2013-04-14 MED ORDER — HYDROCODONE-ACETAMINOPHEN 5-325 MG PO TABS: 1.0000 | ORAL_TABLET | ORAL | Status: DC | PRN

## 2013-04-14 MED ORDER — HYDROCODONE-ACETAMINOPHEN 5-325 MG PO TABS
1.0000 | ORAL_TABLET | Freq: Once | ORAL | Status: AC
  Administered 2013-04-14: 1 via ORAL
  Filled 2013-04-14: qty 1

## 2013-04-14 NOTE — ED Notes (Signed)
Returned from CT.

## 2013-04-14 NOTE — ED Provider Notes (Signed)
Medical screening examination/treatment/procedure(s) were conducted as a shared visit with non-physician practitioner(s) or resident  and myself.  I personally evaluated the patient during the encounter and agree with the findings and plan unless otherwise indicated.    I have personally reviewed any xrays and/ or EKG's with the provider and I agree with interpretation.   Twisted ankle.  Pain with walking. Focal swelling and tenderness lateral mid foot, nv intact, pain with all rom. Xray concern for fx.  CT scan no fx.  Fup outpt, pt has a podiatrist. Air cast.  Right foot strain/injury   Mariea Clonts, MD 04/14/13 [phone]

## 2013-04-14 NOTE — ED Notes (Signed)
The pt twisted her rt foot and ankle  Tonight while puttin her daughter to bed,  Bruised and  Swollen laterally.  lmp now

## 2013-04-14 NOTE — ED Provider Notes (Signed)
CSN: [id number]     Arrival date & time 04/14/13  0018 History   First MD Initiated Contact with Patient 04/14/13 0045     Chief Complaint  Patient presents with  . Foot Injury   (Consider location/radiation/quality/duration/timing/severity/associated sxs/prior Treatment) HPI Comments: Patient here with right lateral foot hematoma after putting daughter to bed and then getting up and twisting her right foot, she states she immediately felt a pop and then the hematoma came up.  She reports is scheduled for surgery on the foot this week with Dr. Babs Bertin who will be doing a bunionectomy on her.  She has not been ambulatory since the event.    Patient is a 38 y.o. female presenting with foot injury. The history is provided by the patient. No language interpreter was used.  Foot Injury Location:  Foot Time since incident:  2 hours Injury: yes   Mechanism of injury: fall   Fall:    Fall occurred:  Standing   Impact surface:  Hard floor   Entrapped after fall: no   Foot location:  R foot and dorsum of R foot Pain details:    Quality:  Dull and pressure   Radiates to:  Does not radiate   Severity:  Moderate   Onset quality:  Gradual   Duration:  2 days   Timing:  Constant   Progression:  Worsening Chronicity:  New Dislocation: no   Foreign body present:  No foreign bodies Tetanus status:  Up to date Prior injury to area:  Yes Relieved by:  Nothing Worsened by:  Nothing tried Ineffective treatments:  None tried Associated symptoms: decreased ROM   Associated symptoms: no back pain, no fatigue, no fever, no itching, no muscle weakness, no neck pain, no numbness, no stiffness and no tingling     Past Medical History  Diagnosis Date  . GERD (gastroesophageal reflux disease)     OTC as needed  . Other and unspecified disc disorder of lumbar region   . IBS (irritable bowel syndrome)   . B12 deficiency   . Shingles   . Migraine headache   . Interstitial cystitis    Past Surgical  History  Procedure Laterality Date  . Osteotomy and ulnar shortening  04/28/2008    left  . Hardware removal  04/20/2011    Procedure: HARDWARE REMOVAL;  Surgeon: Cammie Sickle., MD;  Location: [facility];  Service: Orthopedics;  Laterality: Left;  removal of plate and screws left ulna   Family History  Problem Relation Age of Onset  . Obesity Father   . Irritable bowel syndrome Sister   . Stomach cancer Mother   . Diabetes Father    History  Substance Use Topics  . Smoking status: Current Every Day Smoker  . Smokeless tobacco: Never Used  . Alcohol Use: No     Comment: rarely   OB History   Grav Para Term Preterm Abortions TAB SAB Ect Mult Living                 Review of Systems  Constitutional: Negative for fever and fatigue.  Musculoskeletal: Negative for back pain, neck pain and stiffness.  Skin: Negative for itching.  All other systems reviewed and are negative.    Allergies  Antihistamines, diphenhydramine-type  Home Medications   Current Outpatient Rx  Name  Route  Sig  Dispense  Refill  . ALPRAZolam (XANAX) 0.5 MG tablet   Oral   Take 0.5 mg by mouth  2 (two) times daily as needed for anxiety.          Marland Kitchen b complex vitamins tablet   Oral   Take 1 tablet by mouth daily.         . Biotin 10 MG TABS   Oral   Take 10 mg by mouth daily.         . cyclobenzaprine (FLEXERIL) 10 MG tablet   Oral   Take 10 mg by mouth daily as needed for muscle spasms.          Marland Kitchen dicyclomine (BENTYL) 10 MG capsule   Oral   Take 10 mg by mouth daily as needed for spasms.         Marland Kitchen oxyCODONE-acetaminophen (PERCOCET/ROXICET) 5-325 MG per tablet   Oral   Take 1 tablet by mouth every 4 (four) hours as needed for pain.          BP 150/84  Pulse 105  Temp(Src) 98.1 F (36.7 C) (Oral)  Resp 22  SpO2 100%  LMP 04/14/2013 Physical Exam  Nursing note and vitals reviewed. Constitutional: She is oriented to person, place, and time. She appears  well-developed and well-nourished. No distress.  HENT:  Head: Normocephalic and atraumatic.  Mouth/Throat: Oropharynx is clear and moist.  Eyes: Conjunctivae are normal. No scleral icterus.  Neck: Normal range of motion.  Pulmonary/Chest: Effort normal.  Musculoskeletal:       Right ankle: She exhibits normal range of motion, no swelling and no ecchymosis. No tenderness.       Right foot: She exhibits decreased range of motion, tenderness, swelling and deformity. She exhibits normal capillary refill.       Feet:  Hematoma to right lateral dorsum of foot, 2+ DP pulse, 2+ PT pulse, sensation intact distally  Neurological: She is alert and oriented to person, place, and time. She exhibits normal muscle tone. Coordination normal.  Skin: Skin is warm and dry. No rash noted. No erythema. No pallor.  Psychiatric: She has a normal mood and affect. Her behavior is normal. Judgment and thought content normal.    ED Course  Procedures (including critical care time) Labs Review Labs Reviewed - No data to display Imaging Review Dg Ankle Complete Right  04/14/2013   CLINICAL DATA:  Trauma with lateral pain and swelling.  EXAM: RIGHT ANKLE - COMPLETE 3+ VIEW  COMPARISON:  No comparisons  FINDINGS: Soft tissue swelling about the lateral hindfoot. No acute fracture or dislocation. Base of fifth metatarsal and talar dome intact.  IMPRESSION: Lateral soft tissue swelling only.   Electronically Signed   By: Abigail Miyamoto M.D.   On: 04/14/2013 01:27   Ct Foot Right Wo Contrast  04/14/2013   CLINICAL DATA:  Twisting injury to right foot and ankle. Lateral right ankle swelling and bruising.  EXAM: CT OF THE RIGHT FOOT WITHOUT CONTRAST  TECHNIQUE: Multidetector CT imaging was performed according to the standard protocol. Multiplanar CT image reconstructions were also generated.  COMPARISON:  Right foot radiographs performed earlier today at 1:08 a.m.  FINDINGS: There is no evidence of fracture or dislocation. The  medial and lateral malleoli remain intact. The talar dome demonstrates normal alignment. The ankle mortise is grossly unremarkable in appearance. The subtalar joint is within normal limits. Visualized joint spaces are preserved.  Focal soft tissue swelling is noted along the dorsolateral aspect of the midfoot. No significant soft tissue swelling is seen about the ankle joint. No ankle joint effusion is identified. The visualized flexor  and extensor tendons are grossly unremarkable in appearance. The peroneus tendons are grossly intact. The Achilles tendon remains intact. The vasculature is not well assessed without contrast.  IMPRESSION: 1. No evidence of fracture or dislocation. 2. Focal soft tissue swelling noted along the dorsolateral aspect of the midfoot. The ankle joint is unremarkable in appearance.   Electronically Signed   By: Garald Balding M.D.   On: 04/14/2013 03:21   Dg Foot Complete Right  04/14/2013   CLINICAL DATA:  Trauma with lateral pain and swelling.  EXAM: RIGHT FOOT COMPLETE - 3+ VIEW  COMPARISON:  Ankle films same date.  FINDINGS: Soft tissue swelling is seen about the lateral a hindfoot. On the oblique view, subtle lucency through the distal cuboid is suspected.  IMPRESSION: Cannot exclude nondisplaced fracture through the distal cuboid. Adjacent soft tissue swelling, most apparent on ankle films. If there is point tenderness in this area, consider CT.   Electronically Signed   By: Abigail Miyamoto M.D.   On: 04/14/2013 01:29    EKG Interpretation   None       MDM  Right foot hematoma  Patient here with injury to right foot - noted with large hematoma, CT without fracture, x-ray showed possible cuboid fracture but this is not seen on CT.  She already has air-cast so she will be placed in that - will give short course of pain medication - she will follow up with Dr. Babs Bertin either Monday or Tuesday.    Idalia Needle Joelyn Oms, [place] 04/14/13 [phone]

## 2013-04-14 NOTE — Discharge Instructions (Signed)
Contusion A contusion is a deep bruise. Contusions are the result of an injury that caused bleeding under the skin. The contusion may turn blue, purple, or yellow. Minor injuries will give you a painless contusion, but more severe contusions may stay painful and swollen for a few weeks.  CAUSES  A contusion is usually caused by a blow, trauma, or direct force to an area of the body. SYMPTOMS   Swelling and redness of the injured area.  Bruising of the injured area.  Tenderness and soreness of the injured area.  Pain. DIAGNOSIS  The diagnosis can be made by taking a history and physical exam. An X-ray, CT scan, or MRI may be needed to determine if there were any associated injuries, such as fractures. TREATMENT  Specific treatment will depend on what area of the body was injured. In general, the best treatment for a contusion is resting, icing, elevating, and applying cold compresses to the injured area. Over-the-counter medicines may also be recommended for pain control. Ask your caregiver what the best treatment is for your contusion. HOME CARE INSTRUCTIONS   Put ice on the injured area.  Put ice in a plastic bag.  Place a towel between your skin and the bag.  Leave the ice on for 15-20 minutes, 03-04 times a day.  Only take over-the-counter or prescription medicines for pain, discomfort, or fever as directed by your caregiver. Your caregiver may recommend avoiding anti-inflammatory medicines (aspirin, ibuprofen, and naproxen) for 48 hours because these medicines may increase bruising.  Rest the injured area.  If possible, elevate the injured area to reduce swelling. SEEK IMMEDIATE MEDICAL CARE IF:   You have increased bruising or swelling.  You have pain that is getting worse.  Your swelling or pain is not relieved with medicines. MAKE SURE YOU:   Understand these instructions.  Will watch your condition.  Will get help right away if you are not doing well or get  worse. Document Released: 12/21/2004 Document Revised: 06/05/2011 Document Reviewed: 01/16/2011 [facility] Patient Information 2014 [place], [place].  Foot Contusion A foot contusion is a deep bruise to the foot. Contusions are the result of an injury that caused bleeding under the skin. The contusion may turn blue, purple, or yellow. Minor injuries will give you a painless contusion, but more severe contusions may stay painful and swollen for a few weeks. CAUSES  A foot contusion comes from a direct blow to that area, such as a heavy object falling on the foot. SYMPTOMS   Swelling of the foot.  Discoloration of the foot.  Tenderness or soreness of the foot. DIAGNOSIS  You will have a physical exam and will be asked about your history. You may need an X-ray of your foot to look for a broken bone (fracture).  TREATMENT  An elastic wrap may be recommended to support your foot. Resting, elevating, and applying cold compresses to your foot are often the best treatments for a foot contusion. Over-the-counter medicines may also be recommended for pain control. HOME CARE INSTRUCTIONS   Put ice on the injured area.  Put ice in a plastic bag.  Place a towel between your skin and the bag.  Leave the ice on for 15-20 minutes, 03-04 times a day.  Only take over-the-counter or prescription medicines for pain, discomfort, or fever as directed by your caregiver.  If told, use an elastic wrap as directed. This can help reduce swelling. You may remove the wrap for sleeping, showering, and bathing. If your  toes become numb, cold, or blue, take the wrap off and reapply it more loosely.  Elevate your foot with pillows to reduce swelling.  Try to avoid standing or walking while the foot is painful. Do not resume use until instructed by your caregiver. Then, begin use gradually. If pain develops, decrease use. Gradually increase activities that do not cause discomfort until you have normal use of your  foot.  See your caregiver as directed. It is very important to keep all follow-up appointments in order to avoid any lasting problems with your foot, including long-term (chronic) pain. SEEK IMMEDIATE MEDICAL CARE IF:   You have increased redness, swelling, or pain in your foot.  Your swelling or pain is not relieved with medicines.  You have loss of feeling in your foot or are unable to move your toes.  Your foot turns cold or blue.  You have pain when you move your toes.  Your foot becomes warm to the touch.  Your contusion does not improve in 2 days. MAKE SURE YOU:   Understand these instructions.  Will watch your condition.  Will get help right away if you are not doing well or get worse. Document Released: 01/02/2006 Document Revised: 09/12/2011 Document Reviewed: 02/14/2011 [facility] Patient Information 2014 [place], [place].

## 2013-04-14 NOTE — ED Notes (Signed)
Transported to CT 

## 2013-04-15 HISTORY — PX: FOOT SURGERY: SHX648

## 2013-08-15 ENCOUNTER — Telehealth: Payer: Self-pay | Admitting: Gynecology

## 2013-08-15 NOTE — Telephone Encounter (Signed)
Patient calling with concerns regarding increase periods since 06/25/13. She states she has had 4 periods since 06/25/13 and she is concerned. Denies complaints other than bleeding. She states she called today, because she started another period.  Patient states she is not on birth control, is sexually active, but husband has vasectomy. Patient denies heavy bleeding at this time, but states prior periods have been heavy. Has not been seen in our office since 2013.  Patient scheduled for office visit 08/19/13 with [address].  Routing to provider for final review. Patient agreeable to disposition. Will close encounter ;

## 2013-08-15 NOTE — Telephone Encounter (Signed)
Pt wants to schedule an appointment she is having abnormal menstrual cycles.

## 2013-08-19 ENCOUNTER — Ambulatory Visit (INDEPENDENT_AMBULATORY_CARE_PROVIDER_SITE_OTHER): Payer: 59 | Admitting: Nurse Practitioner

## 2013-08-19 ENCOUNTER — Encounter: Payer: Self-pay | Admitting: Nurse Practitioner

## 2013-08-19 VITALS — BP 130/76 | HR 104 | Ht 64.0 in | Wt 141.0 lb

## 2013-08-19 DIAGNOSIS — N926 Irregular menstruation, unspecified: Secondary | ICD-10-CM

## 2013-08-19 LAB — POCT URINE PREGNANCY: Preg Test, Ur: NEGATIVE

## 2013-08-19 NOTE — Progress Notes (Signed)
Subjective:     Patient ID: Katelyn Welch, female   DOB: October 10, 1975, 38 y.o.   MRN: [id number]  HPI This 38 yo G1, P1 WM Fe with history of polymenorrhea.  Menses record as noted: March: 9-13 - normal menses April 6-8 mini cycle April 19 -  24 -regular menses May 1 - 5 regular menses May 22- present Usually menses will be regular and only symptom is low back pain.  Husband had a vasectomy many years ago.  She has noted a few times the bleeding was noted after SA by 1-2 days.  Other recent stressors have been related to work, which has worsened this past year.  She has also been getting steroid injections, multiple times since last year for right forefoot pain and right plantar facsitis  Review of Systems  HENT: Negative.   Respiratory: Negative.   Cardiovascular: Negative.   Gastrointestinal: Negative.   Genitourinary: Positive for vaginal bleeding and menstrual problem. Negative for urgency, frequency, flank pain, vaginal discharge, vaginal pain, pelvic pain and dyspareunia.  Musculoskeletal: Positive for arthralgias and joint swelling.       Right foot  Neurological: Negative.   Psychiatric/Behavioral: Negative.        Objective:   Physical Exam  Constitutional: She appears well-developed and well-nourished. No distress.  Abdominal: Soft. She exhibits no distension. There is no tenderness. There is no rebound and no guarding.  Genitourinary:  Very light pink vaginal spotting.  No polyps seen. Uterus is smooth with normal size.  Neurological: She is alert.  Skin: Skin is warm and dry.  Psychiatric: She has a normal mood and affect. Her behavior is normal. Judgment and thought content normal.       Assessment:     AUB X 1 month    Plan:     Will get PUS/ [facility] and follow with Dr. Charlies Constable She will also need AEX in the near future.

## 2013-08-19 NOTE — Patient Instructions (Signed)

## 2013-08-21 ENCOUNTER — Telehealth: Payer: Self-pay | Admitting: Gynecology

## 2013-08-21 NOTE — Telephone Encounter (Signed)
Left message for patient to call back. Need to go over benefits and schedule [facility]. °

## 2013-08-22 NOTE — Progress Notes (Signed)
Encounter reviewed by Dr. Brook Silva.  

## 2013-08-26 NOTE — Telephone Encounter (Signed)
Left message for patient to call back. Need to go over benefits and schedule [facility]. °

## 2013-09-04 NOTE — Telephone Encounter (Signed)
Left message for patient to call back. Need to go over benefits and schedule [facility]. °

## 2013-09-04 NOTE — Telephone Encounter (Signed)
I will make Dr. Charlies Constable informed of this since we can not get her to call back.  She has history of irregular and prolonged bleeding.  She is also due for AEX with Dr. Charlies Constable.

## 2013-09-05 NOTE — Telephone Encounter (Signed)
Can she be contacted by mail?

## 2013-09-05 NOTE — Telephone Encounter (Signed)
Mailed the In-Office procedure form that includes patient out of pocket amount for [facility] and description of service. Also a note requesting that the patient contact us to schedule.

## 2013-09-23 ENCOUNTER — Telehealth: Payer: Self-pay | Admitting: Gynecology

## 2013-09-23 NOTE — Telephone Encounter (Signed)
Encounter created in error

## 2014-01-26 ENCOUNTER — Encounter: Payer: Self-pay | Admitting: Nurse Practitioner

## 2014-06-12 ENCOUNTER — Other Ambulatory Visit: Payer: Self-pay | Admitting: Specialist

## 2014-06-12 DIAGNOSIS — M5416 Radiculopathy, lumbar region: Secondary | ICD-10-CM

## 2014-06-12 DIAGNOSIS — M5412 Radiculopathy, cervical region: Secondary | ICD-10-CM

## 2014-06-18 ENCOUNTER — Ambulatory Visit
Admission: RE | Admit: 2014-06-18 | Discharge: 2014-06-18 | Disposition: A | Discharge status: Home / Self Care | Payer: 59 | Source: Ambulatory Visit | Attending: Specialist | Admitting: Specialist

## 2014-06-18 DIAGNOSIS — M5412 Radiculopathy, cervical region: Secondary | ICD-10-CM

## 2014-06-18 DIAGNOSIS — M5416 Radiculopathy, lumbar region: Secondary | ICD-10-CM

## 2015-09-21 ENCOUNTER — Telehealth: Payer: Self-pay | Admitting: Nurse Practitioner

## 2015-09-21 NOTE — Telephone Encounter (Signed)
Patient is having heavy bleeding and would like an appointment. Patient last seen 08/19/13.

## 2015-09-21 NOTE — Telephone Encounter (Signed)
Spoke with patient. Patient states she usually has a cycle that comes every month at the same time and lasts for 7 days or less. Started her menses on 09/12/2015. Bleeding was light until 09/17/2015. On 09/17/2015 bleeding began to increase. On Monday 09/20/2015 between 6 pm and 12 am her bleeding increased further to having to change her tampon every 1 and 1/2 hours with a lot of clotting. Today patient has been changing her tampon every 2 hours. Denies any SOB, weakness, light headedness, or fatigue. "I have not felt myself over the last couple of weeks though." Patient's husband has had a vasectomy. Advised she will need to be seen in the office for further evaluation. Offered appointment for today at 4 pm with Kem Boroughs, FNP but patient declines. Appointment scheduled for 09/22/2015 at 8:30 am with Kem Boroughs, FNP. Advised if her bleeding increases to having to change her tampon every hour due to bleeding through, becomes SOB, light headed. Or dizzy she will need to be seen at a local ER over night for evaluation. She is agreeable and verbalizes understanding.  Routing to provider for final review. Patient agreeable to disposition. Will close encounter.

## 2015-09-22 ENCOUNTER — Ambulatory Visit (INDEPENDENT_AMBULATORY_CARE_PROVIDER_SITE_OTHER): Payer: 59 | Admitting: Nurse Practitioner

## 2015-09-22 ENCOUNTER — Encounter: Payer: Self-pay | Admitting: Nurse Practitioner

## 2015-09-22 VITALS — BP 124/80 | HR 76 | Ht 64.0 in | Wt 159.0 lb

## 2015-09-22 DIAGNOSIS — N939 Abnormal uterine and vaginal bleeding, unspecified: Secondary | ICD-10-CM | POA: Diagnosis not present

## 2015-09-22 DIAGNOSIS — N926 Irregular menstruation, unspecified: Secondary | ICD-10-CM | POA: Diagnosis not present

## 2015-09-22 LAB — CBC WITH DIFFERENTIAL/PLATELET
BASOS PCT: 0 %
Basophils Absolute: 0 cells/uL (ref 0–200)
Eosinophils Absolute: 146 cells/uL (ref 15–500)
Eosinophils Relative: 2 %
HCT: 38.2 % (ref 35.0–45.0)
Hemoglobin: 12.7 g/dL (ref 11.7–15.5)
Lymphocytes Relative: 36 %
Lymphs Abs: 2628 cells/uL (ref 850–3900)
MCH: 31.7 pg (ref 27.0–33.0)
MCHC: 33.2 g/dL (ref 32.0–36.0)
MCV: 95.3 fL (ref 80.0–100.0)
MONOS PCT: 9 %
MPV: 9.2 fL (ref 7.5–12.5)
Monocytes Absolute: 657 cells/uL (ref 200–950)
Neutro Abs: 3869 cells/uL (ref 1500–7800)
Neutrophils Relative %: 53 %
PLATELETS: 331 10*3/uL (ref 140–400)
RBC: 4.01 MIL/uL (ref 3.80–5.10)
RDW: 13.3 % (ref 11.0–15.0)
WBC: 7.3 10*3/uL (ref 3.8–10.8)

## 2015-09-22 LAB — POCT URINE PREGNANCY: Preg Test, Ur: NEGATIVE

## 2015-09-22 LAB — FSH/LH
FSH: 2.9 m[IU]/mL
LH: 1 m[IU]/mL

## 2015-09-22 LAB — PROLACTIN: Prolactin: 3.9 ng/mL

## 2015-09-22 LAB — TSH: TSH: 1.1 mIU/L

## 2015-09-22 LAB — HEMOGLOBIN, FINGERSTICK: Hemoglobin, fingerstick: 13 g/dL (ref 12.0–16.0)

## 2015-09-22 MED ORDER — MEDROXYPROGESTERONE ACETATE 10 MG PO TABS
10.0000 mg | ORAL_TABLET | Freq: Every day | ORAL | Status: DC
Start: 2015-09-22 — End: 2015-10-20

## 2015-09-22 NOTE — Progress Notes (Signed)
Encounter reviewed by Dr. Brook Amundson C. Silva.  

## 2015-09-22 NOTE — Progress Notes (Signed)
Subjective:     Patient ID: Katelyn Welch, female   DOB: 10-Dec-1975, 40 y.o.   MRN: [id number]  HPI  This 40 yo G1,P1 WM Female presents today with AUB.    Patient states she usually has a cycle that comes every month at the same time and lasts for 3-4 days or less. Flow is moderate, heavy X 1 day, light..  Some low back pain, bloating ,no PMS.   Started her menses on 09/12/2015. Bleeding was light until 09/17/2015. On 6/25 awoke with heavy bleeding and clots with soaking tampon and bleeding through.  On 09/17/2015 bleeding began to increase. On Monday 09/20/2015 between 6 pm and 12 am her bleeding increased further to having to change her tampon every 1/2 to 1 hours with a lot of clotting. Yesterday patient has been changing her tampon every 2 hours. Denies any SOB, weakness, light headedness, or fatigue. "I have not felt myself over the last couple of weeks with fatigue." Patient's husband has had a vasectomy.     Review of Systems  Constitutional: Positive for fatigue and unexpected weight change. Negative for fever, chills and appetite change.       Gained 20 lbs in 1.5 yrs  Gastrointestinal: Positive for diarrhea and constipation.       Related to pain meds  Endocrine: Positive for heat intolerance.  Genitourinary: Positive for vaginal bleeding and menstrual problem. Negative for dysuria, vaginal discharge, difficulty urinating, vaginal pain, pelvic pain and dyspareunia.  Musculoskeletal: Positive for neck pain.  Neurological: Negative for dizziness, weakness and light-headedness.  Psychiatric/Behavioral: Positive for sleep disturbance.       Chronic restlessness.   She was advised to have PUS with similar episode of AUB in 2015.  She is not sure why she did not get this done.    Objective:   Physical Exam  Constitutional: She is oriented to person, place, and time. She appears well-developed and well-nourished. No distress.  Cardiovascular: Normal rate.   Pulmonary/Chest: Effort normal.   Abdominal: Soft. She exhibits no distension and no mass. There is no tenderness. There is no rebound and no guarding.  Genitourinary:  Light flow with no uterine enlargement.   No adnexal mass or pain.  Neurological: She is alert and oriented to person, place, and time.  Psychiatric: She has a normal mood and affect. Her behavior is normal. Judgment and thought content normal.  HGB 13.0 UPT: negative    Assessment:     AUB Perimenopausal Need for PUS Need for AEX    Plan:     Will get PUS and follow Will start on Provera 10 mg X 10 to stop this cycle then expect a withdrawal bleed that actually will be at time of her normal menses. Will follow with labs:TSH, HGB AIC, FSH/LH, CBC, prolactin  She needs to schedule AEX.

## 2015-09-23 ENCOUNTER — Telehealth: Payer: Self-pay | Admitting: *Deleted

## 2015-09-23 LAB — HEMOGLOBIN A1C
HEMOGLOBIN A1C: 5.3 % (ref <5.7)
Mean Plasma Glucose: 105 mg/dL

## 2015-09-23 NOTE — Telephone Encounter (Signed)
While advising patient of lab results, pt requested clarification of provera use.  Pt would like to know when she will stop bleeding. Verified with Verline Lema, RN that pt should see a decrease in bleeding each day and should stop within 3-5 days.  Pt is agreeable.  Pt will await call to schedule pelvic ultrasound.  Routing to provider for final review.  Closing encounter.

## 2015-10-05 ENCOUNTER — Telehealth: Payer: Self-pay | Admitting: Nurse Practitioner

## 2015-10-05 NOTE — Telephone Encounter (Signed)
Return call to patient. She is currently in [place] and will be back in town late Wednesday night.  She has completed her Provera 10 mg for 10 days, missed one dose and took it on Saturday to finish course. She states her bleeding had lightened but never stopped. Now reports "not super heavy" bleeding but is changing her pad Q 4 hours. Denies dizziness and weakness.  She reports that she knew her withdrawal bleeding would coincide with her cycle, however, she feels that it may be too early to be on her cycle.     She is scheduled for an pelvic ultrasound on Tuesday 10/12/15.   If needs additional Provera, send to [facility].

## 2015-10-05 NOTE — Telephone Encounter (Signed)
Patient is still having heavy bleeding.

## 2015-10-06 NOTE — Telephone Encounter (Signed)
Call to patient and she is given message from Kem Boroughs, [place]. Patient states her bleeding is less today and having to change pad less often.  Denies complaints, has a headache but feels it is related to her current travel to [place].   Patient advised to call back or seek immediate medical care if bleeding restarts and becomes heavy or develops SOB, CP, or weakness. She verbalizes understanding of instructions and will follow up for Pelvic ultrasound as scheduled.

## 2015-10-06 NOTE — Telephone Encounter (Signed)
Per Dr. Talbert Nan we will not add more Provera now.  This current bleeding since light should improve by her apt on Tuesday for PUS.

## 2015-10-12 ENCOUNTER — Encounter: Payer: Self-pay | Admitting: Obstetrics and Gynecology

## 2015-10-12 ENCOUNTER — Ambulatory Visit (INDEPENDENT_AMBULATORY_CARE_PROVIDER_SITE_OTHER): Payer: 59

## 2015-10-12 ENCOUNTER — Ambulatory Visit (INDEPENDENT_AMBULATORY_CARE_PROVIDER_SITE_OTHER): Payer: 59 | Admitting: Obstetrics and Gynecology

## 2015-10-12 VITALS — BP 110/60 | HR 84 | Resp 14 | Wt 161.0 lb

## 2015-10-12 DIAGNOSIS — N939 Abnormal uterine and vaginal bleeding, unspecified: Secondary | ICD-10-CM

## 2015-10-12 DIAGNOSIS — N83202 Unspecified ovarian cyst, left side: Secondary | ICD-10-CM

## 2015-10-12 DIAGNOSIS — N926 Irregular menstruation, unspecified: Secondary | ICD-10-CM | POA: Diagnosis not present

## 2015-10-12 NOTE — Progress Notes (Signed)
GYNECOLOGY  VISIT   HPI: 40 y.o.   Married  Caucasian  female   G1P1001 with Patient's last menstrual period was 09/12/2015 (exact date).   here c/o irregular menstrual cycles. Prior to her June cycle she was bleeding every month x 3-4 days. Saturating a regular tampon in up to 2-3 hours at most. Some moderate lower prior to her cycle. No prior intermenstrual cycle. In June her cycle came on time, bleed for a month. Stopped bleeding on July 14 with provera, stopped her 10 day course of provera about a week prior to her bleeding ended. Her bleeding got lighter on the provera. Prior to the provera the bleeding was heavy daily, passing large blood clots, saturating a regular tampon in less than an hours. No cramps.  Sexually active, occasional deep dyspareunia, usually positional. No post coital bleeding. Normal TSH, prolactin, CBC, FSH.  GYNECOLOGIC HISTORY: Patient's last menstrual period was 09/12/2015 (exact date). Contraception:Vasectomy  Menopausal hormone therapy: none         OB History    Gravida Para Term Preterm AB TAB SAB Ectopic Multiple Living   1 1 1  0 0 0 0 0 0 1         There are no active problems to display for this patient.   Past Medical History  Diagnosis Date  . GERD (gastroesophageal reflux disease)     OTC as needed  . Other and unspecified disc disorder of lumbar region   . IBS (irritable bowel syndrome)   . B12 deficiency   . Shingles   . Migraine without aura     Past Surgical History  Procedure Laterality Date  . Osteotomy and ulnar shortening  04/28/2008    left  . Hardware removal  04/20/2011    Procedure: HARDWARE REMOVAL;  Surgeon: Cammie Sickle., MD;  Location: [place] [facility];  Service: Orthopedics;  Laterality: Left;  removal of plate and screws left ulna  . Foot surgery Right 04/15/13    Current Outpatient Prescriptions  Medication Sig Dispense Refill  . ALPRAZolam (XANAX) 0.5 MG tablet Take 0.5 mg by mouth 2 (two) times  daily as needed for anxiety.     . Biotin 10 MG TABS Take 10 mg by mouth daily.    . cyclobenzaprine (FLEXERIL) 10 MG tablet Take 10 mg by mouth daily as needed for muscle spasms.     Marland Kitchen dicyclomine (BENTYL) 10 MG capsule Take 10 mg by mouth daily as needed for spasms.    Marland Kitchen escitalopram (LEXAPRO) 10 MG tablet Take 1 tablet by mouth daily.    . medroxyPROGESTERone (PROVERA) 10 MG tablet Take 1 tablet (10 mg total) by mouth daily. 10 tablet 0  . Multiple Vitamin (MULTIVITAMIN) tablet Take 1 tablet by mouth daily.    Marland Kitchen omeprazole (PRILOSEC) 20 MG capsule Take 1 capsule by mouth daily.    Marland Kitchen oxyCODONE-acetaminophen (PERCOCET) 10-325 MG tablet Take 1 tablet by mouth every 4 (four) hours as needed.    . Probiotic Product (PROBIOTIC PO) Take 1 capsule by mouth daily.     No current facility-administered medications for this visit.     ALLERGIES: Antihistamines, diphenhydramine-type and Clemastine  Family History  Problem Relation Age of Onset  . Obesity Father   . Irritable bowel syndrome Sister   . Stomach cancer Mother   . Diabetes Father     Social History   Social History  . Marital Status: Married    Spouse Name: N/A  .  Number of Children: 1  . Years of Education: N/A   Occupational History  . Account Manager    Social History Main Topics  . Smoking status: Current Every Day Smoker  . Smokeless tobacco: Never Used  . Alcohol Use: No     Comment: rarely  . Drug Use: No  . Sexual Activity: Yes    Birth Control/ Protection: Surgical     Comment: vasectomy   Other Topics Concern  . Not on file   Social History Narrative    Review of Systems  Constitutional: Negative.   HENT: Negative.   Eyes: Negative.   Respiratory: Negative.   Cardiovascular: Negative.   Gastrointestinal: Negative.   Genitourinary: Negative.   Musculoskeletal: Negative.   Skin: Negative.   Neurological: Negative.   Endo/Heme/Allergies: Negative.   Psychiatric/Behavioral: Negative.      PHYSICAL EXAMINATION:    BP 110/60 mmHg  Pulse 84  Resp 14  Wt 161 lb (73.029 kg)  LMP 09/12/2015 (Exact Date)    General appearance: alert, cooperative and appears stated age  ASSESSMENT Episode of abnormal uterine bleeding, normal blood work, no ultrasound findings to explain abnormal bleeding. Suspect anovulatory bleed. Already treated with provera Left ovarian cyst, complex, suspect hemorrhagic CL    PLAN Not a candidate for OCP's (smoker) Discussed the option of the minipill or the mirnea IUD For now she will calendar her cycles, if she has persistent AUB, would recommend endometrial biopsy Will f/u on her cycles at her f/u u/s visit F/U ultrasound in 2 months   An After Visit Summary was printed and given to the patient.  15 minutes face to face time of which over 50% was spent in counseling.

## 2015-10-12 NOTE — Addendum Note (Signed)
Addended by: Michele Mcalpine on: 10/12/2015 11:05 AM   Modules accepted: Orders

## 2015-10-20 ENCOUNTER — Encounter: Payer: Self-pay | Admitting: Nurse Practitioner

## 2015-10-20 ENCOUNTER — Ambulatory Visit (INDEPENDENT_AMBULATORY_CARE_PROVIDER_SITE_OTHER): Payer: 59 | Admitting: Nurse Practitioner

## 2015-10-20 VITALS — BP 136/84 | HR 84 | Resp 18 | Ht 63.75 in | Wt 161.0 lb

## 2015-10-20 DIAGNOSIS — Z01419 Encounter for gynecological examination (general) (routine) without abnormal findings: Secondary | ICD-10-CM | POA: Diagnosis not present

## 2015-10-20 DIAGNOSIS — Z0000 Encounter for general adult medical examination without abnormal findings: Secondary | ICD-10-CM | POA: Diagnosis not present

## 2015-10-20 NOTE — Patient Instructions (Signed)

## 2015-10-20 NOTE — Progress Notes (Signed)
Reviewed personally.  M. Suzanne Alaine Loughney, MD.  

## 2015-10-20 NOTE — Progress Notes (Signed)
Patient ID: Katelyn Welch, female   DOB: Mar 29, 1975, 40 y.o.   MRN: [id number]  40 y.o. G30P1001 Married  Caucasian Fe here for annual exam.  No further vaginal bleeding after 3 weeks even with Provera at cycle in June.  She had PUS and was found to have right OV cyst.  She was to start this cycle on Monday and has not started.  No chance of pregnancy.  Patient's last menstrual period was 09/12/2015 (exact date).    - 10/08/15      Sexually active: Yes.    The current method of family planning is vasectomy.    Exercising: Yes.    Home exercise routine includes walking and cardio 4-5 days per week. Smoker:  no  Health Maintenance: Pap: 01/05/12, Negative with neg HR HPV MMG:  Never Colonoscopy:  06/05/12 2 small polyp that was benign, repeat at 40 years old TDaP:  ?? HIV: 2007 with pregnancy Labs: PCP takes care of all labs, recent labs in EPIC   reports that she has been smoking.  She has never used smokeless tobacco. She reports that she does not drink alcohol or use drugs.  Past Medical History:  Diagnosis Date  . B12 deficiency   . GERD (gastroesophageal reflux disease)    OTC as needed  . IBS (irritable bowel syndrome)   . Migraine without aura   . Other and unspecified disc disorder of lumbar region   . Shingles     Past Surgical History:  Procedure Laterality Date  . FOOT SURGERY Right 04/15/13  . HARDWARE REMOVAL  04/20/2011   Procedure: HARDWARE REMOVAL;  Surgeon: Cammie Sickle., MD;  Location: [place];  Service: Orthopedics;  Laterality: Left;  removal of plate and screws left ulna  . OSTEOTOMY AND ULNAR SHORTENING  04/28/2008   left    Current Outpatient Prescriptions  Medication Sig Dispense Refill  . ALPRAZolam (XANAX) 0.5 MG tablet Take 0.5 mg by mouth 2 (two) times daily as needed for anxiety.     . Biotin 10 MG TABS Take 10 mg by mouth daily.    . cyclobenzaprine (FLEXERIL) 10 MG tablet Take 10 mg by mouth daily as needed for muscle spasms.      Marland Kitchen dicyclomine (BENTYL) 10 MG capsule Take 10 mg by mouth daily as needed for spasms.    Marland Kitchen escitalopram (LEXAPRO) 10 MG tablet Take 1 tablet by mouth daily.    . medroxyPROGESTERone (PROVERA) 10 MG tablet Take 1 tablet (10 mg total) by mouth daily. 10 tablet 0  . Multiple Vitamin (MULTIVITAMIN) tablet Take 1 tablet by mouth daily.    Marland Kitchen omeprazole (PRILOSEC) 20 MG capsule Take 1 capsule by mouth daily.    Marland Kitchen oxyCODONE-acetaminophen (PERCOCET) 10-325 MG tablet Take 1 tablet by mouth every 4 (four) hours as needed.    . Probiotic Product (PROBIOTIC PO) Take 1 capsule by mouth daily.     No current facility-administered medications for this visit.     Family History  Problem Relation Age of Onset  . Obesity Father   . Diabetes Father   . Irritable bowel syndrome Sister   . Stomach cancer Mother     ROS:  Pertinent items are noted in HPI.  Otherwise, a comprehensive ROS was negative.  Exam:   LMP 09/12/2015 (Exact Date) Comment: continues to bleed   Ht Readings from Last 3 Encounters:  09/22/15 5\' 4"  (1.626 m)  08/19/13 5\' 4"  (1.626 m)  07/09/12 5\' 4"  (  1.626 m)    General appearance: alert, cooperative and appears stated age Head: Normocephalic, without obvious abnormality, atraumatic Neck: no adenopathy, supple, symmetrical, trachea midline and thyroid normal to inspection and palpation Lungs: clear to auscultation bilaterally Breasts: normal appearance, no masses or tenderness Heart: regular rate and rhythm Abdomen: soft, non-tender; no masses,  no organomegaly Extremities: extremities normal, atraumatic, no cyanosis or edema Skin: Skin color, texture, turgor normal. No rashes or lesions Lymph nodes: Cervical, supraclavicular, and axillary nodes normal. No abnormal inguinal nodes palpated Neurologic: Grossly normal   Pelvic: External genitalia:  no lesions              Urethra:  normal appearing urethra with no masses, tenderness or lesions              Bartholin's and  Skene's: normal                 Vagina: normal appearing vagina with normal color and discharge, no lesions              Cervix: anteverted              Pap taken: Yes.   Bimanual Exam:  Uterus:  normal size, contour, position, consistency, mobility, non-tender              Adnexa: no mass, fullness, tenderness               Rectovaginal: Confirms               Anus:  normal sphincter tone, no lesions  Chaperone present: yes  A:  Well Woman with normal exam  Recent AUB with finding of right OV cyst X 2  - for repeat PUS in 2 months  Husband with vasectomy  Current amenorrhea of 4 days  FMH:  Mother with colon and stomach cancer at age 39    P:   Reviewed health and wellness pertinent to exam  Pap smear done today  Mammogram is now due and she is given a list of facilities -she will make apt.  Follow with pap  Counseled on breast self exam, mammography screening, adequate intake of calcium and vitamin D, diet and exercise return annually or prn  An After Visit Summary was printed and given to the patient.

## 2015-10-22 ENCOUNTER — Telehealth: Payer: Self-pay

## 2015-10-22 ENCOUNTER — Encounter: Payer: Self-pay | Admitting: Nurse Practitioner

## 2015-10-22 LAB — IPS PAP TEST WITH HPV

## 2015-10-22 NOTE — Telephone Encounter (Signed)
Spoke with patient. Advised of results as seen below from Kem Boroughs, [place]. Patient is agreeable and verbalizes understanding.  Notes Recorded by Graylon Good, CMA on 10/22/2015 at 9:16 AM EDT AEX 10/20/16 ------  Notes Recorded by Kem Boroughs, FNP on 10/22/2015 at 8:22 AM EDT pap02   Visit Follow-Up Question  Message [id number]  From Linward Natal To Kem Boroughs, FNP Sent 10/22/2015 8:26 AM  I have a question about IPS PAP TEST WITH HPV resulted on 10/22/15, 7:49 AM.   There are a lot of words that I have no idea what they are or mean. Can you in English tell me if everything is ok? Please and thank you   Responsible Party   Pool - [facility] Clinical Pool No one has taken responsibility for this message.  No actions have been taken on this message.   Routing to provider for final review. Patient agreeable to disposition. Will close encounter.

## 2015-10-22 NOTE — Telephone Encounter (Signed)
Please see telephone note created to discuss mychart message with patient dated with today's date.

## 2015-11-09 ENCOUNTER — Encounter: Payer: Self-pay | Admitting: Obstetrics & Gynecology

## 2015-11-09 ENCOUNTER — Telehealth: Payer: Self-pay | Admitting: Nurse Practitioner

## 2015-11-09 DIAGNOSIS — N939 Abnormal uterine and vaginal bleeding, unspecified: Secondary | ICD-10-CM

## 2015-11-09 NOTE — Telephone Encounter (Signed)
Spoke with patient. Patient states that in June she had a menses for 22 days that was heavy with clotting. She was seen in the office and started on Provera 10 mg for 10 days. Had a PUS with Dr.Jertson in June which was normal. 2 month follow up ultrasound and EMB recommended. Patient reports she did not have a menses in July. Last Thursday 11/04/2015 her menses started. Bleeding was light until this morning. Reports she is now having to change her tampon every 2 hours due to bleeding through and is having increased clotting.Patient's husband has had a vasectomy. Patient is asking how she should proceed with further evaluation at this time. Advised I will speak with Dr.Jertson and return call with further recommendations. She is agreeable.

## 2015-11-09 NOTE — Telephone Encounter (Signed)
Patient is having excessive bleeding and clotting and would like to talk with Patty Grubb's nurse.

## 2015-11-09 NOTE — Telephone Encounter (Signed)
She needs to come in for an endometrial biopsy. I called patient. Her bleeding has been heavier and longer than normal. I've recommended she come in for an endometrial biopsy. She is expecting a call to set this up. Please call her.

## 2015-11-10 ENCOUNTER — Encounter: Payer: Self-pay | Admitting: Obstetrics and Gynecology

## 2015-11-10 ENCOUNTER — Ambulatory Visit (INDEPENDENT_AMBULATORY_CARE_PROVIDER_SITE_OTHER): Payer: 59 | Admitting: Obstetrics and Gynecology

## 2015-11-10 DIAGNOSIS — N939 Abnormal uterine and vaginal bleeding, unspecified: Secondary | ICD-10-CM

## 2015-11-10 LAB — POCT URINE PREGNANCY: Preg Test, Ur: NEGATIVE

## 2015-11-10 MED ORDER — MEDROXYPROGESTERONE ACETATE 10 MG PO TABS: 10.0000 mg | ORAL_TABLET | Freq: Every day | ORAL | 0 refills | Status: DC

## 2015-11-10 NOTE — Progress Notes (Signed)
GYNECOLOGY  VISIT   HPI: 40 y.o.   Married  Caucasian  female   G1P1001 with Patient's last menstrual period was 11/04/2015.   here for evaluation of abnormal uterine bleeding. She had normal cycles until May, then in June her cycle came on time and lasted for a month. She was treated with provera. Normal TSH, prolactin, CBC and FSH. She started bleeding on 11/04/15. Yesterday morning her bleeding was very heavy. She saturated her sheets overnight. Saturating a pad every 2 hours. Normally changes a pad in 4-6 hours. Light during the day yesterday, heavy last night and light again today. Vasectomy for contraception. No pain. Not light headed or dizzy.   GYNECOLOGIC HISTORY: Patient's last menstrual period was 11/04/2015. Contraception: vasectomy  Menopausal hormone therapy: None        OB History    Gravida Para Term Preterm AB Living   1 1 1  0 0 1   SAB TAB Ectopic Multiple Live Births   0 0 0 0 1         There are no active problems to display for this patient.   Past Medical History:  Diagnosis Date  . B12 deficiency   . GERD (gastroesophageal reflux disease)    OTC as needed  . IBS (irritable bowel syndrome)   . Migraine without aura   . Other and unspecified disc disorder of lumbar region   . Shingles     Past Surgical History:  Procedure Laterality Date  . FOOT SURGERY Right 04/15/13  . HARDWARE REMOVAL  04/20/2011   Procedure: HARDWARE REMOVAL;  Surgeon: Cammie Sickle., MD;  Location: [place];  Service: Orthopedics;  Laterality: Left;  removal of plate and screws left ulna  . OSTEOTOMY AND ULNAR SHORTENING  04/28/2008   left    Current Outpatient Prescriptions  Medication Sig Dispense Refill  . ALPRAZolam (XANAX) 0.5 MG tablet Take 0.5 mg by mouth 2 (two) times daily as needed for anxiety.     . Biotin 10 MG TABS Take 10 mg by mouth daily.    . cyclobenzaprine (FLEXERIL) 10 MG tablet Take 10 mg by mouth daily as needed for muscle spasms.     Marland Kitchen  dicyclomine (BENTYL) 10 MG capsule Take 10 mg by mouth daily as needed for spasms.    Marland Kitchen escitalopram (LEXAPRO) 10 MG tablet Take 1 tablet by mouth daily.    . Multiple Vitamin (MULTIVITAMIN) tablet Take 1 tablet by mouth daily.    Marland Kitchen omeprazole (PRILOSEC) 20 MG capsule Take 1 capsule by mouth daily.    Marland Kitchen oxyCODONE-acetaminophen (PERCOCET) 10-325 MG tablet Take 1 tablet by mouth every 4 (four) hours as needed.    . Probiotic Product (PROBIOTIC PO) Take 1 capsule by mouth daily.     No current facility-administered medications for this visit.      ALLERGIES: Antihistamines, diphenhydramine-type and Clemastine  Family History  Problem Relation Age of Onset  . Obesity Father   . Diabetes Father   . Irritable bowel syndrome Sister   . Colon cancer Mother 46    partial colectomy with 12" small and large intestines.  . Stomach cancer Mother 48    Social History   Social History  . Marital status: Married    Spouse name: N/A  . Number of children: 1  . Years of education: N/A   Occupational History  . Account Manager    Social History Main Topics  . Smoking status: Current Every Day Smoker  .  Smokeless tobacco: Never Used  . Alcohol use No     Comment: rarely  . Drug use: No  . Sexual activity: Yes    Birth control/ protection: Surgical     Comment: vasectomy   Other Topics Concern  . Not on file   Social History Narrative  . No narrative on file    Review of Systems  Constitutional: Negative.   HENT: Negative.   Eyes: Negative.   Respiratory: Negative.   Cardiovascular: Negative.   Gastrointestinal: Negative.   Genitourinary:       Heavy menstrual bleeding Unscheduled bleeding   Musculoskeletal: Negative.   Skin: Negative.   Neurological: Negative.   Endo/Heme/Allergies: Negative.   Psychiatric/Behavioral: Negative.     PHYSICAL EXAMINATION:    BP 122/70 (BP Location: Right Arm, Patient Position: Sitting, Cuff Size: Normal)   Pulse 84   Resp 16   Ht 5'  3.75" (1.619 m)   Wt 162 lb (73.5 kg)   LMP 11/04/2015 Comment: 6/18-7/14  BMI 28.03 kg/m     General appearance: alert, cooperative and appears stated age Neck: no adenopathy, supple, symmetrical, trachea midline and thyroid normal to inspection and palpation Abdomen: soft, non-tender; bowel sounds normal; no masses,  no organomegaly  Pelvic: External genitalia:  no lesions              Urethra:  normal appearing urethra with no masses, tenderness or lesions              Bartholins and Skenes: normal                 Vagina: normal appearing vagina with normal color and discharge, no lesions, small amount of   blood in her vagina              Cervix: no lesions              The risks of endometrial biopsy were reviewed and a consent was obtained.  A speculum was placed in the vagina and the cervix was cleansed with betadine. A tenaculum was placed on the cervix and the mini-pipelle was placed into the endometrial cavity. The uterus sounded to 8 cm. The endometrial biopsy was performed, moderate tissue was obtained. The tenaculum and speculum were removed. There were no complications.    Chaperone was present for exam.  ASSESSMENT Abnormal uterine bleeding, normal labs, normal uterine ultrasound last month H/O a complex ovarian cyst, is due for a f/u ultrasound in mid September, 2017    PLAN UPT negative Endometrial biopsy done Currently not bleeding excessively, will give her a script of provera to take if her bleeding gets very heavy Call if saturating > 1 pad/hour, if feeling light headed or with any other concerns Need to consider the mini-pill or the mirnea IUD for cycle control (smoker, can't take OCP's) Discussed the options, information on the mirena was given.  Would pretreat with cytotec (cervix is tight)   An After Visit Summary was printed and given to the patient.

## 2015-11-10 NOTE — Patient Instructions (Signed)

## 2015-11-10 NOTE — Addendum Note (Signed)
Addended by: Dorothy Spark on: 11/10/2015 03:35 PM   Modules accepted: Orders

## 2015-11-10 NOTE — Telephone Encounter (Signed)
Spoke with patient. Appointment for EMB scheduled for today at 2:30 pm with Dr.Jertson. She is agreeable to date and time. Pre procedure instructions given.  Motrin instructions given. Motrin=Advil=Ibuprofen, 800 mg one hour before appointment. Eat a meal and hydrate well before appointment. Order placed for precert.  Cc: Theresia Lo  Routing to provider for final review. Patient agreeable to disposition. Will close encounter.

## 2015-11-12 ENCOUNTER — Encounter: Payer: Self-pay | Admitting: Obstetrics and Gynecology

## 2015-11-15 ENCOUNTER — Other Ambulatory Visit: Payer: Self-pay | Admitting: *Deleted

## 2015-11-15 NOTE — Telephone Encounter (Signed)
See phone encounter.

## 2015-11-15 NOTE — Telephone Encounter (Signed)
My Chart message from patient:  Hi   Well, that's good news I guess. Let's start with the mini pill. I'm not sure how I feel about the IUD. I'm having the other prescription filled tomorrow because I'm still bleeding but not super heavy   Thanks!   Call to patient, left message to call back regarding starting minipill. Per DPR, ok to leave detailed message. See previous result note from Dr Talbert Nan.

## 2015-11-16 MED ORDER — NORETHINDRONE 0.35 MG PO TABS: 1.0000 | ORAL_TABLET | Freq: Every day | ORAL | 0 refills | Status: DC

## 2015-11-16 NOTE — Telephone Encounter (Signed)
Called and spoke with patient. Informed her know the mini pill has been called in and instructed her how to start taking it. Also advised her to keep record of her bleeding (days bled and light moderate or heavy). Patient voiced understanding. Follow up appointment for November made. -eh

## 2015-11-16 NOTE — Telephone Encounter (Signed)
Please let the patient know that a 3 month script for the minipill has been sent to her pharmacy. She should start the pill on the first day of her next cycle, which should be in about 2 weeks. She may have some abnormal bleeding as she is adjusting to the pill. Please have her calendar her bleeding for the next 3 months, then f/u to review.

## 2015-11-16 NOTE — Telephone Encounter (Signed)
Return call from patient. She would like to begin the minipill. Has never been on this before. Husband with vasectomy. States she is still spotting from procedure. Advised of differences in progesterone only pill versus previous combination pills she has taken in the past. Advised to try and take at same time every day, all are active pills. Will take several cycles for body to adjust to this regimen.     Routing to Dr Talbert Nan for review.

## 2015-11-29 ENCOUNTER — Encounter: Payer: Self-pay | Admitting: Obstetrics and Gynecology

## 2015-11-30 ENCOUNTER — Telehealth: Payer: Self-pay

## 2015-11-30 NOTE — Telephone Encounter (Signed)
Have her start the mini-pill

## 2015-11-30 NOTE — Telephone Encounter (Signed)
Telephone encounter created to discuss mychart message with patient and Dr.Jertson.

## 2015-11-30 NOTE — Telephone Encounter (Signed)
Spoke with patient. Patient states that she took her last dose of Provera 10 mg on 11/23/2015. Reports while taking the Provera her bleeding decreased to spotting, but did not stop. Since completing the Provera she has been having bleeding like a "normal" menstrual cycle. Patient is changing her pad every 3-4 hours. Is feeling increasingly fatigued, but denies weakness or feeling light headed. Patient reports she is currently on cycle day 27. She is due to start her menses for the month of September on 12/02/2015, but has not stopped bleeding since her last menses started on 11/04/2015. Patient is asking if she may start on the mini pill at this time. Per recommendations from [place] patient was to start mini pill with withdrawal bleed after completing Provera (please see OV note from 11/10/2015). Advised patient I will speak with Dr.Jertson and return call with further recommendations. Patient is agreeable.  Visit Follow-Up Question  Message [id number]  From Katelyn Welch To Salvadore Dom, MD Sent 11/29/2015 11:24 AM  Hello Dr Talbert Nan   I have taken the 10 day Medroxypr and I am still bleeding. The nurse told me to start the Norethindrone when my cycle started over. According to my app, today is day 26 in my cycle. Can I go ahead and start it? I've been bleeding since Aug 10th and the last 3 days have been as heavy as a full period.   thanks in advance!  [name]   Responsible Party   Pool - [facility] Pool No one has taken responsibility for this message.  No actions have been taken on this message.

## 2015-11-30 NOTE — Telephone Encounter (Signed)
Spoke with patient. Advised as seen below from Dr. Talbert Nan. Patient is agreeable and verbalizes understanding. Patient aware if bleeding persist will need evaluation in office with Dr. Talbert Nan.   Routing to provider for final review. Patient is agreeable to disposition. Will close encounter.

## 2015-12-01 ENCOUNTER — Encounter: Payer: Self-pay | Admitting: Obstetrics and Gynecology

## 2015-12-14 ENCOUNTER — Other Ambulatory Visit: Payer: Self-pay

## 2015-12-14 ENCOUNTER — Other Ambulatory Visit: Payer: Self-pay | Admitting: Obstetrics and Gynecology

## 2015-12-14 ENCOUNTER — Encounter: Payer: Self-pay | Admitting: Obstetrics and Gynecology

## 2015-12-14 ENCOUNTER — Ambulatory Visit (INDEPENDENT_AMBULATORY_CARE_PROVIDER_SITE_OTHER): Payer: 59 | Admitting: Obstetrics and Gynecology

## 2015-12-14 ENCOUNTER — Ambulatory Visit (INDEPENDENT_AMBULATORY_CARE_PROVIDER_SITE_OTHER): Payer: 59

## 2015-12-14 VITALS — BP 110/70 | HR 76 | Resp 18 | Wt 163.0 lb

## 2015-12-14 DIAGNOSIS — N83202 Unspecified ovarian cyst, left side: Secondary | ICD-10-CM | POA: Diagnosis not present

## 2015-12-14 DIAGNOSIS — N939 Abnormal uterine and vaginal bleeding, unspecified: Secondary | ICD-10-CM | POA: Diagnosis not present

## 2015-12-14 NOTE — Progress Notes (Signed)
GYNECOLOGY  VISIT   HPI: 40 y.o.   Married  Caucasian  female   G1P1001 with Patient's last menstrual period was 11/04/2015.   here for follow up abnormal uterine bleeding and f/u ovarian cyst. Since her last visit she has started on the micronor and is doing well. No cycle yet (overdue).   GYNECOLOGIC HISTORY: Patient's last menstrual period was 11/04/2015. Contraception:OCP Menopausal hormone therapy: none        OB History    Gravida Para Term Preterm AB Living   1 1 1  0 0 1   SAB TAB Ectopic Multiple Live Births   0 0 0 0 1         There are no active problems to display for this patient.   Past Medical History:  Diagnosis Date  . B12 deficiency   . GERD (gastroesophageal reflux disease)    OTC as needed  . IBS (irritable bowel syndrome)   . Migraine without aura   . Other and unspecified disc disorder of lumbar region   . Shingles     Past Surgical History:  Procedure Laterality Date  . FOOT SURGERY Right 04/15/13  . HARDWARE REMOVAL  04/20/2011   Procedure: HARDWARE REMOVAL;  Surgeon: Cammie Sickle., MD;  Location: [place];  Service: Orthopedics;  Laterality: Left;  removal of plate and screws left ulna  . OSTEOTOMY AND ULNAR SHORTENING  04/28/2008   left    Current Outpatient Prescriptions  Medication Sig Dispense Refill  . ALPRAZolam (XANAX) 0.5 MG tablet Take 0.5 mg by mouth 2 (two) times daily as needed for anxiety.     . Biotin 10 MG TABS Take 10 mg by mouth daily.    . cyclobenzaprine (FLEXERIL) 10 MG tablet Take 10 mg by mouth daily as needed for muscle spasms.     Marland Kitchen dicyclomine (BENTYL) 10 MG capsule Take 10 mg by mouth daily as needed for spasms.    Marland Kitchen escitalopram (LEXAPRO) 10 MG tablet Take 1 tablet by mouth daily.    . medroxyPROGESTERone (PROVERA) 10 MG tablet Take 1 tablet (10 mg total) by mouth daily. 10 tablet 0  . Multiple Vitamin (MULTIVITAMIN) tablet Take 1 tablet by mouth daily.    . norethindrone (ORTHO  MICRONOR) 0.35 MG tablet Take 1 tablet (0.35 mg total) by mouth daily. 3 Package 0  . omeprazole (PRILOSEC) 20 MG capsule Take 1 capsule by mouth daily.    Marland Kitchen oxyCODONE-acetaminophen (PERCOCET) 10-325 MG tablet Take 1 tablet by mouth every 4 (four) hours as needed.    . Probiotic Product (PROBIOTIC PO) Take 1 capsule by mouth daily.     No current facility-administered medications for this visit.      ALLERGIES: Antihistamines, diphenhydramine-type and Clemastine  Family History  Problem Relation Age of Onset  . Obesity Father   . Diabetes Father   . Irritable bowel syndrome Sister   . Colon cancer Mother 23    partial colectomy with 12" small and large intestines.  . Stomach cancer Mother 42    Social History   Social History  . Marital status: Married    Spouse name: N/A  . Number of children: 1  . Years of education: N/A   Occupational History  . Account Manager    Social History Main Topics  . Smoking status: Current Every Day Smoker  . Smokeless tobacco: Never Used  . Alcohol use No     Comment: rarely  .  Drug use: No  . Sexual activity: Yes    Birth control/ protection: Surgical     Comment: vasectomy   Other Topics Concern  . Not on file   Social History Narrative  . No narrative on file    Review of Systems  Constitutional: Negative.   HENT: Negative.   Eyes: Negative.   Respiratory: Negative.   Cardiovascular: Negative.   Gastrointestinal: Negative.   Genitourinary:       Abnormal uterine bleeding   Musculoskeletal: Negative.   Skin: Negative.   Neurological: Negative.   Endo/Heme/Allergies: Negative.   Psychiatric/Behavioral: Negative.     PHYSICAL EXAMINATION:    BP 110/70 (BP Location: Right Arm, Patient Position: Sitting, Cuff Size: Normal)   Pulse 76   Resp 18   Wt 163 lb (73.9 kg)   LMP 11/04/2015   BMI 28.20 kg/m     General appearance: alert, cooperative and appears stated age  Ultrasound images reviewed with the patient. Her  uterine lining is very thin, few small myomas (unchanged). Her left ovary has 2 small simple cysts (largest is 2.6 cm, improved)  ASSESSMENT H/O AUB, negative endometrial biopsy, recently started on micronor. Thin lining on ultrasound Ovarian cyst, smaller, benign appearing    PLAN F/U in 11/17 for a pill check No further ultrasounds needed at this time    An After Visit Summary was printed and given to the patient.  CC: Hedwig Morton, NP

## 2016-02-22 ENCOUNTER — Ambulatory Visit: Payer: Self-pay | Admitting: Obstetrics and Gynecology

## 2016-02-24 ENCOUNTER — Ambulatory Visit (INDEPENDENT_AMBULATORY_CARE_PROVIDER_SITE_OTHER): Payer: 59 | Admitting: Obstetrics and Gynecology

## 2016-02-24 ENCOUNTER — Encounter: Payer: Self-pay | Admitting: Obstetrics and Gynecology

## 2016-02-24 VITALS — BP 118/68 | HR 88 | Resp 16 | Ht 64.0 in | Wt 162.0 lb

## 2016-02-24 DIAGNOSIS — Z3041 Encounter for surveillance of contraceptive pills: Secondary | ICD-10-CM

## 2016-02-24 DIAGNOSIS — N939 Abnormal uterine and vaginal bleeding, unspecified: Secondary | ICD-10-CM

## 2016-02-24 MED ORDER — NORETHINDRONE 0.35 MG PO TABS: 1.0000 | ORAL_TABLET | Freq: Every day | ORAL | 2 refills | Status: DC

## 2016-02-24 NOTE — Progress Notes (Signed)
GYNECOLOGY  VISIT   HPI: 40 y.o.   Married  Caucasian  female   G1P1001 with Patient's last menstrual period was 01/30/2016.   here for 14month follow-up of OCP. The patient was started on micronor to try and control AUB. She had a normal ultrasound, normal labs and a negative endometrial biopsy. She is a smoker and can't take regular OCP's. She has been on the mini-pill for the last 3 months. Started on 11/29/15. First cycle on 10/15, x 4 days, saturating a pad in 4 hours. Next cycle on 11/5 x 4 days, normal flow.  No side effects with the pill.  GYNECOLOGIC HISTORY: Patient's last menstrual period was 01/30/2016. Contraception:OCP Menopausal hormone therapy: none        OB History    Gravida Para Term Preterm AB Living   1 1 1  0 0 1   SAB TAB Ectopic Multiple Live Births   0 0 0 0 1         There are no active problems to display for this patient.   Past Medical History:  Diagnosis Date  . B12 deficiency   . GERD (gastroesophageal reflux disease)    OTC as needed  . IBS (irritable bowel syndrome)   . Migraine without aura   . Other and unspecified disc disorder of lumbar region   . Shingles     Past Surgical History:  Procedure Laterality Date  . FOOT SURGERY Right 04/15/13  . HARDWARE REMOVAL  04/20/2011   Procedure: HARDWARE REMOVAL;  Surgeon: Cammie Sickle., MD;  Location: [place];  Service: Orthopedics;  Laterality: Left;  removal of plate and screws left ulna  . OSTEOTOMY AND ULNAR SHORTENING  04/28/2008   left    Current Outpatient Prescriptions  Medication Sig Dispense Refill  . ALPRAZolam (XANAX) 0.5 MG tablet Take 0.5 mg by mouth 2 (two) times daily as needed for anxiety.     . Biotin 10 MG TABS Take 10 mg by mouth daily.    . cyclobenzaprine (FLEXERIL) 10 MG tablet Take 10 mg by mouth daily as needed for muscle spasms.     Marland Kitchen dicyclomine (BENTYL) 10 MG capsule Take 10 mg by mouth daily as needed for spasms.    Marland Kitchen escitalopram (LEXAPRO) 10 MG  tablet Take 1 tablet by mouth daily.    . Multiple Vitamin (MULTIVITAMIN) tablet Take 1 tablet by mouth daily.    . norethindrone (ORTHO MICRONOR) 0.35 MG tablet Take 1 tablet (0.35 mg total) by mouth daily. 3 Package 0  . omeprazole (PRILOSEC) 20 MG capsule Take 1 capsule by mouth daily.    Marland Kitchen oxyCODONE-acetaminophen (PERCOCET) 10-325 MG tablet Take 1 tablet by mouth every 4 (four) hours as needed.    . Probiotic Product (PROBIOTIC PO) Take 1 capsule by mouth daily.     No current facility-administered medications for this visit.      ALLERGIES: Antihistamines, diphenhydramine-type and Clemastine  Family History  Problem Relation Age of Onset  . Obesity Father   . Diabetes Father   . Irritable bowel syndrome Sister   . Colon cancer Mother 20    partial colectomy with 12" small and large intestines.  . Stomach cancer Mother 54    Social History   Social History  . Marital status: Married    Spouse name: N/A  . Number of children: 1  . Years of education: N/A   Occupational History  . Account Manager    Social History Main  Topics  . Smoking status: Current Every Day Smoker  . Smokeless tobacco: Never Used  . Alcohol use No     Comment: rarely  . Drug use: No  . Sexual activity: Yes    Birth control/ protection: Surgical     Comment: vasectomy   Other Topics Concern  . Not on file   Social History Narrative  . No narrative on file    ROS  PHYSICAL EXAMINATION:    BP 118/68 (BP Location: Right Arm, Patient Position: Sitting, Cuff Size: Normal)   Pulse 88   Resp 16   Ht 5\' 4"  (1.626 m)   Wt 162 lb (73.5 kg)   LMP 01/30/2016   BMI 27.81 kg/m     General appearance: alert, cooperative and appears stated age  ASSESSMENT AUB, negative w/u. So far better on the micronor    PLAN Continue with the micronor F/U with Ms Raquel Sarna in 7/18 for an annual exam Parameters given as to when she should call for abnormal bleeding   An After Visit Summary was printed and  given to the patient.

## 2016-05-15 ENCOUNTER — Encounter (HOSPITAL_BASED_OUTPATIENT_CLINIC_OR_DEPARTMENT_OTHER): Payer: Self-pay | Admitting: *Deleted

## 2016-05-15 ENCOUNTER — Emergency Department (HOSPITAL_BASED_OUTPATIENT_CLINIC_OR_DEPARTMENT_OTHER)
Admission: EM | Admit: 2016-05-15 | Discharge: 2016-05-15 | Disposition: A | Discharge status: Home / Self Care | Payer: 59 | Attending: Emergency Medicine | Admitting: Emergency Medicine

## 2016-05-15 ENCOUNTER — Emergency Department (HOSPITAL_BASED_OUTPATIENT_CLINIC_OR_DEPARTMENT_OTHER): Payer: 59

## 2016-05-15 DIAGNOSIS — S6992XA Unspecified injury of left wrist, hand and finger(s), initial encounter: Secondary | ICD-10-CM | POA: Diagnosis present

## 2016-05-15 DIAGNOSIS — R51 Headache: Secondary | ICD-10-CM | POA: Insufficient documentation

## 2016-05-15 DIAGNOSIS — Y92410 Unspecified street and highway as the place of occurrence of the external cause: Secondary | ICD-10-CM | POA: Insufficient documentation

## 2016-05-15 DIAGNOSIS — M25551 Pain in right hip: Secondary | ICD-10-CM | POA: Insufficient documentation

## 2016-05-15 DIAGNOSIS — Z79899 Other long term (current) drug therapy: Secondary | ICD-10-CM | POA: Diagnosis not present

## 2016-05-15 DIAGNOSIS — T148XXA Other injury of unspecified body region, initial encounter: Secondary | ICD-10-CM

## 2016-05-15 DIAGNOSIS — Y9389 Activity, other specified: Secondary | ICD-10-CM | POA: Insufficient documentation

## 2016-05-15 DIAGNOSIS — F17200 Nicotine dependence, unspecified, uncomplicated: Secondary | ICD-10-CM | POA: Diagnosis not present

## 2016-05-15 DIAGNOSIS — S60222A Contusion of left hand, initial encounter: Secondary | ICD-10-CM | POA: Diagnosis not present

## 2016-05-15 DIAGNOSIS — Y999 Unspecified external cause status: Secondary | ICD-10-CM | POA: Diagnosis not present

## 2016-05-15 DIAGNOSIS — M25552 Pain in left hip: Secondary | ICD-10-CM | POA: Diagnosis not present

## 2016-05-15 MED ORDER — BACITRACIN ZINC 500 UNIT/GM EX OINT: TOPICAL_OINTMENT | Freq: Two times a day (BID) | CUTANEOUS | Status: DC

## 2016-05-15 MED ORDER — OXYCODONE-ACETAMINOPHEN 5-325 MG PO TABS
1.0000 | ORAL_TABLET | Freq: Once | ORAL | Status: AC
  Administered 2016-05-15: 1 via ORAL
  Filled 2016-05-15: qty 1

## 2016-05-15 MED ORDER — METHOCARBAMOL 500 MG PO TABS: 500.0000 mg | ORAL_TABLET | Freq: Two times a day (BID) | ORAL | 0 refills | Status: DC | PRN

## 2016-05-15 MED ORDER — IBUPROFEN 800 MG PO TABS: 800.0000 mg | ORAL_TABLET | Freq: Three times a day (TID) | ORAL | 0 refills | Status: AC | PRN

## 2016-05-15 MED FILL — IBUPROFEN 800 MG TABLET: 800 | 7 days supply | Qty: 21 | Fill #0

## 2016-05-15 MED FILL — METHOCARBAMOL 500 MG TABLET: 500 | 6 days supply | Qty: 12 | Fill #0
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
## 2016-05-15 NOTE — ED Notes (Signed)
Patient is resting comfortably. 

## 2016-09-25 ENCOUNTER — Telehealth: Payer: Self-pay | Admitting: Nurse Practitioner

## 2016-09-25 NOTE — Telephone Encounter (Signed)
Left message regarding upcoming appointment has been canceled and needs to be rescheduled. °

## 2016-10-20 ENCOUNTER — Ambulatory Visit: Payer: 59 | Admitting: Certified Nurse Midwife

## 2016-10-20 ENCOUNTER — Ambulatory Visit: Payer: 59 | Admitting: Nurse Practitioner
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
## 2020-08-09 NOTE — Progress Notes (Signed)
45 y.o. G68P1001 Married White or Caucasian female here for annual exam.    Last period passing clots the size of her thumb, lasted 17 days Had regular periods before that. Was evaluated with pelvic US and EMB in 2017 for a simlilar bleed, but much less bleeding then. Not bleeding now, denies pain. Denies concern of STD. Denies vaginal irritation, itching or burning.  Not interested in IUD, sister had bad experience.  Period Duration (Days):  (usually 4-5, last month lasted 17) Period Pattern: Regular (usually) Menstrual Flow:  (usually moderate to heavy, last time heavy) Menstrual Control: Maxi pad,Tampon Dysmenorrhea:  (last month severe cramping) Patient's last menstrual period was 07/11/2020.           Works at [employer], lives with husband and daughter. Busy with daugthers activities (volleyball). Lost a son at age 42.  Sexually active: Yes.    The current method of family planning is vasectomy.    Exercising: No.  exercise Smoker:  no  Health Maintenance: Pap:  10-20-15 neg HPV HR neg History of abnormal Pap:  yes MMG:  Schedule for 08-31-2020 Colonoscopy:  3/14 BMD:   none Gardasil:   n/a Covid-19: not done Hep C testing: not done Screening Labs: done with PCP   reports that she has been smoking. She has been smoking about 1.00 pack per day. She has never used smokeless tobacco. She reports that she does not drink alcohol and does not use drugs.  Past Medical History:  Diagnosis Date  . Abnormal Pap smear of cervix   . B12 deficiency   . GERD (gastroesophageal reflux disease)    OTC as needed  . IBS (irritable bowel syndrome)   . Migraine without aura   . Other and unspecified disc disorder of lumbar region   . Shingles     Past Surgical History:  Procedure Laterality Date  . FOOT SURGERY Right 04/15/13  . HARDWARE REMOVAL  04/20/2011   Procedure: HARDWARE REMOVAL;  Surgeon: Cammie Sickle., MD;  Location: [facility];  Service: Orthopedics;   Laterality: Left;  removal of plate and screws left ulna  . OSTEOTOMY AND ULNAR SHORTENING  04/28/2008   left    Current Outpatient Medications  Medication Sig Dispense Refill  . amitriptyline (ELAVIL) 25 MG tablet Take by mouth.    . Biotin 10 MG TABS Take 10 mg by mouth daily.    . celecoxib (CELEBREX) 200 MG capsule Take 200 mg by mouth 2 (two) times daily.    . colestipol (COLESTID) 1 g tablet Take by mouth.    . escitalopram (LEXAPRO) 10 MG tablet Take 1 tablet by mouth daily.    Marland Kitchen gabapentin (NEURONTIN) 300 MG capsule Take by mouth.    Marland Kitchen ibuprofen (ADVIL,MOTRIN) 800 MG tablet Take 1 tablet (800 mg total) by mouth every 8 (eight) hours as needed. 21 tablet 0  . modafinil (PROVIGIL) 200 MG tablet     . Multiple Vitamin (MULTIVITAMIN) tablet Take 1 tablet by mouth daily.    Marland Kitchen omeprazole (PRILOSEC) 20 MG capsule Take 1 capsule by mouth daily.    Marland Kitchen oxyCODONE-acetaminophen (PERCOCET) 10-325 MG tablet Take 1 tablet by mouth every 4 (four) hours as needed.    . Probiotic Product (PROBIOTIC PO) Take 1 capsule by mouth daily.    . Topiramate ER (TROKENDI XR) 100 MG CP24 Take by mouth.    . valACYclovir (VALTREX) 1000 MG tablet Take by mouth.     No current facility-administered medications for  this visit.    Family History  Problem Relation Age of Onset  . Obesity Father   . Diabetes Father   . Hypertension Father   . Irritable bowel syndrome Sister   . Colon cancer Mother 83       partial colectomy with 12" small and large intestines.    Review of Systems  Constitutional: Negative.   HENT: Negative.   Eyes: Negative.   Respiratory: Negative.   Cardiovascular: Negative.   Gastrointestinal: Negative.   Endocrine: Negative.   Genitourinary:       Last menstrual cycle was heavy & painful  Musculoskeletal: Negative.   Skin: Negative.   Allergic/Immunologic: Negative.   Neurological: Negative.   Hematological: Negative.   Psychiatric/Behavioral: Negative.     Exam:   BP  110/70   Pulse 88   Resp 16   Ht 5' 3.25" (1.607 m)   Wt 165 lb (74.8 kg)   LMP 07/11/2020 Comment: 07-11-20 thry 07-24-2020  BMI 29.00 kg/m   Height: 5' 3.25" (160.7 cm)  General appearance: alert, cooperative and appears stated age, no acute distress Head: Normocephalic, without obvious abnormality Neck: no adenopathy, thyroid normal to inspection and palpation Lungs: few wheezes, more left side Breasts: No axillary or supraclavicular adenopathy, Normal to palpation without dominant masses Heart: regular rate and rhythm Abdomen: soft, non-tender; no masses,  no organomegaly Extremities: extremities normal, no edema Skin: No rashes or lesions Lymph nodes: Cervical, supraclavicular, and axillary nodes normal. No abnormal inguinal nodes palpated Neurologic: Grossly normal   Pelvic: External genitalia:  no lesions              Urethra:  normal appearing urethra with no masses, tenderness or lesions              Bartholins and Skenes: normal                 Vagina: normal appearing vagina, appropriate for age, normal appearing discharge, no lesions              Cervix: neg cervical motion tenderness, no visible lesions             Bimanual Exam:   Uterus:  normal size, contour, position, consistency, mobility, non-tender              Adnexa: no mass, fullness, tenderness                 Joy, CMA Chaperone was present for exam.  A. Well woman exam - Plan: Cytology - PAP( [facility])  Menorrhagia with regular cycle - Plan: CBC, TSH, US PELVIS TRANSVAGINAL NON-OB (TV ONLY)  Tobacco use, not ready to discuss. Advised wheezing may be early sign of emphysema, can f/u with PCP  P:   Pap : collected today  Mammogram: scheduled next month  Labs: CBC, TSH  Medications: micronor  F/u for [place] to determine if EMB is indicated, follow up with Dr. Talbert Nan

## 2020-08-12 ENCOUNTER — Encounter: Payer: Self-pay | Admitting: Nurse Practitioner

## 2020-08-12 ENCOUNTER — Other Ambulatory Visit: Payer: Self-pay

## 2020-08-12 ENCOUNTER — Other Ambulatory Visit (HOSPITAL_COMMUNITY)
Admission: RE | Admit: 2020-08-12 | Discharge: 2020-08-12 | Disposition: A | Discharge status: Home / Self Care | Payer: 59 | Source: Ambulatory Visit | Attending: Nurse Practitioner | Admitting: Nurse Practitioner

## 2020-08-12 ENCOUNTER — Ambulatory Visit: Payer: 59 | Admitting: Nurse Practitioner

## 2020-08-12 VITALS — BP 110/70 | HR 88 | Resp 16 | Ht 63.25 in | Wt 165.0 lb

## 2020-08-12 DIAGNOSIS — N920 Excessive and frequent menstruation with regular cycle: Secondary | ICD-10-CM | POA: Diagnosis not present

## 2020-08-12 DIAGNOSIS — Z01419 Encounter for gynecological examination (general) (routine) without abnormal findings: Secondary | ICD-10-CM

## 2020-08-12 LAB — CBC
HCT: 36.8 % (ref 35.0–45.0)
MCV: 96.1 fL (ref 80.0–100.0)

## 2020-08-12 MED ORDER — NORETHINDRONE 0.35 MG PO TABS: 1.0000 | ORAL_TABLET | Freq: Every day | ORAL | 4 refills | Status: DC

## 2020-08-12 NOTE — Patient Instructions (Signed)
Menorrhagia Menorrhagia is when your monthly periods are heavy or last longer than normal. If you have this condition, bleeding and cramping may make it hard for you to do your daily activities. What are the causes? Common causes of this condition include:  Growths in the womb (uterus). These are polyps or fibroids. These growths are not cancer.  Problems with two hormones called estrogen and progesterone.  One of the ovaries not releasing an egg during one or more months.  A problem with the thyroid gland.  Having a device for birth control (IUD).  Side effects of some medicines, such as NSAIDs or blood thinners.  A disorder that stops the blood from clotting normally. What increases the risk? You are more likely to have this condition if you have cancer of the womb. What are the signs or symptoms?  Having to change your pad or tampon every 1-2 hours because it is soaked.  Needing to use pads and tampons at the same time because of heavy bleeding.  Needing to wake up to change your pads or tampons during the night.  Passing blood clots larger than 1 inch (2.5 cm) in size.  Having bleeding that lasts for more than 7 days.  Having symptoms of low iron levels (anemia), such as feeling tired or having shortness of breath. How is this treated? You may not need to be treated for this condition. But if you need treatment, you may be given medicines:  To reduce bleeding during your period. These include birth control medicines.  To make your blood thick. This slows bleeding.  To reduce swelling. Medicines that do this include ibuprofen.  That have a hormone called progestin.  That make the ovaries stop working for a short time.  To treat low iron levels. You will be given iron pills if you have this condition. If medicines do not work, surgery may be done. Surgery may be done to:  Remove a part of the lining of the womb. This lining is called the endometrium. This reduces  bleeding during a period.  Remove growths in the womb. These may be polyps or fibroids.  Remove the entire lining of the womb.  Remove the womb entirely. This procedure is called a hysterectomy.   Follow these instructions at home: Medicines  Take over-the-counter and prescription medicines only as told by your doctor. This includes iron pills.  Do not change or switch medicines without asking your doctor.  Do not take aspirin or medicines that contain aspirin 1 week before or during your period. Aspirin may make bleeding worse. Managing constipation Iron pills may cause trouble pooping (constipation). To prevent or treat problems when pooping, you may need to:  Drink enough fluid to keep your pee (urine) pale yellow.  Take over-the-counter or prescription medicines.  Eat foods that are high in fiber. These include beans, whole grains, and fresh fruits and vegetables.  Limit foods that are high in fat and sugar. These include fried or sweet foods. General instructions  If you need to change your pad or tampon more than once every 2 hours, limit your activity until the bleeding stops.  Eat healthy meals and foods that are high in iron. Foods that have a lot of iron include: ? Leafy green vegetables. ? Meat. ? Liver. ? Eggs. ? Whole-grain breads and cereals.  Do not try to lose weight until your heavy bleeding has stopped and you have normal amounts of iron in your blood. If you need to lose  weight, work with your doctor.  Keep all follow-up visits. Contact a doctor if:  You soak through a pad or tampon every 1 or 2 hours, and this happens every time you have a period.  You need to use pads and tampons at the same time because you are bleeding so much.  You are taking medicine, and: ? You feel like you may vomit. ? You vomit. ? You have watery poop (diarrhea).  You have other problems that may be related to the medicine you are taking. Get help right away if:  You  soak through more than a pad or tampon in 1 hour.  You pass clots bigger than 1 inch (2.5 cm) wide.  You feel short of breath.  You feel like your heart is beating too fast.  You feel dizzy or you faint.  You feel very weak or tired. Summary  Menorrhagia is when your menstrual periods are heavy or last longer than normal.  You may not need to be treated for this condition. If you need treatment, you may be given medicines or have surgery.  Take over-the-counter and prescription medicines only as told by your doctor. This includes iron pills.  Get help right away if you soak through more than a pad or tampon in 1 hour or you pass large clots. Also, get help right away if you feel dizzy, short of breath, or very weak or tired. This information is not intended to replace advice given to you by your health care provider. Make sure you discuss any questions you have with your health care provider. Document Revised: 11/25/2019 Document Reviewed: 11/25/2019 Elsevier Patient Education  2021 [place] Maintenance, Female Adopting a healthy lifestyle and getting preventive care are important in promoting health and wellness. Ask your health care provider about:  The right schedule for you to have regular tests and exams.  Things you can do on your own to prevent diseases and keep yourself healthy. What should I know about diet, weight, and exercise? Eat a healthy diet  Eat a diet that includes plenty of vegetables, fruits, low-fat dairy products, and lean protein.  Do not eat a lot of foods that are high in solid fats, added sugars, or sodium.   Maintain a healthy weight Body mass index (BMI) is used to identify weight problems. It estimates body fat based on height and weight. Your health care provider can help determine your BMI and help you achieve or maintain a healthy weight. Get regular exercise Get regular exercise. This is one of the most important things you can  do for your health. Most adults should:  Exercise for at least 150 minutes each week. The exercise should increase your heart rate and make you sweat (moderate-intensity exercise).  Do strengthening exercises at least twice a week. This is in addition to the moderate-intensity exercise.  Spend less time sitting. Even light physical activity can be beneficial. Watch cholesterol and blood lipids Have your blood tested for lipids and cholesterol at 46 years of age, then have this test every 5 years. Have your cholesterol levels checked more often if:  Your lipid or cholesterol levels are high.  You are older than 46 years of age.  You are at high risk for heart disease. What should I know about cancer screening? Depending on your health history and family history, you may need to have cancer screening at various ages. This may include screening for:  Breast cancer.  Cervical cancer.  Colorectal cancer.  Skin cancer.  Lung cancer. What should I know about heart disease, diabetes, and high blood pressure? Blood pressure and heart disease  High blood pressure causes heart disease and increases the risk of stroke. This is more likely to develop in people who have high blood pressure readings, are of African descent, or are overweight.  Have your blood pressure checked: ? Every 3-5 years if you are 43-23 years of age. ? Every year if you are 50 years old or older. Diabetes Have regular diabetes screenings. This checks your fasting blood sugar level. Have the screening done:  Once every three years after age 91 if you are at a normal weight and have a low risk for diabetes.  More often and at a younger age if you are overweight or have a high risk for diabetes. What should I know about preventing infection? Hepatitis B If you have a higher risk for hepatitis B, you should be screened for this virus. Talk with your health care provider to find out if you are at risk for hepatitis B  infection. Hepatitis C Testing is recommended for:  Everyone born from [birth year] through 1966.  Anyone with known risk factors for hepatitis C. Sexually transmitted infections (STIs)  Get screened for STIs, including gonorrhea and chlamydia, if: ? You are sexually active and are younger than 46 years of age. ? You are older than 47 years of age and your health care provider tells you that you are at risk for this type of infection. ? Your sexual activity has changed since you were last screened, and you are at increased risk for chlamydia or gonorrhea. Ask your health care provider if you are at risk.  Ask your health care provider about whether you are at high risk for HIV. Your health care provider may recommend a prescription medicine to help prevent HIV infection. If you choose to take medicine to prevent HIV, you should first get tested for HIV. You should then be tested every 3 months for as long as you are taking the medicine. Pregnancy  If you are about to stop having your period (premenopausal) and you may become pregnant, seek counseling before you get pregnant.  Take 400 to 800 micrograms (mcg) of folic acid every day if you become pregnant.  Ask for birth control (contraception) if you want to prevent pregnancy. Osteoporosis and menopause Osteoporosis is a disease in which the bones lose minerals and strength with aging. This can result in bone fractures. If you are 8 years old or older, or if you are at risk for osteoporosis and fractures, ask your health care provider if you should:  Be screened for bone loss.  Take a calcium or vitamin D supplement to lower your risk of fractures.  Be given hormone replacement therapy (HRT) to treat symptoms of menopause. Follow these instructions at home: Lifestyle  Do not use any products that contain nicotine or tobacco, such as cigarettes, e-cigarettes, and chewing tobacco. If you need help quitting, ask your health care  provider.  Do not use street drugs.  Do not share needles.  Ask your health care provider for help if you need support or information about quitting drugs. Alcohol use  Do not drink alcohol if: ? Your health care provider tells you not to drink. ? You are pregnant, may be pregnant, or are planning to become pregnant.  If you drink alcohol: ? Limit how much you use to 0-1 drink a day. ? Limit  intake if you are breastfeeding.  Be aware of how much alcohol is in your drink. In the [place] one drink equals one 12 oz bottle of beer (355 mL), one 5 oz glass of wine (148 mL), or one 1 oz glass of hard liquor (44 mL). General instructions  Schedule regular health, dental, and eye exams.  Stay current with your vaccines.  Tell your health care provider if: ? You often feel depressed. ? You have ever been abused or do not feel safe at home. Summary  Adopting a healthy lifestyle and getting preventive care are important in promoting health and wellness.  Follow your health care provider's instructions about healthy diet, exercising, and getting tested or screened for diseases.  Follow your health care provider's instructions on monitoring your cholesterol and blood pressure. This information is not intended to replace advice given to you by your health care provider. Make sure you discuss any questions you have with your health care provider. Document Revised: 03/06/2018 Document Reviewed: 03/06/2018 Elsevier Patient Education  2021 [employer].

## 2020-08-13 ENCOUNTER — Other Ambulatory Visit: Payer: Self-pay | Admitting: Nurse Practitioner

## 2020-08-13 DIAGNOSIS — D72829 Elevated white blood cell count, unspecified: Secondary | ICD-10-CM

## 2020-08-13 LAB — CBC
Hemoglobin: 12.6 g/dL (ref 11.7–15.5)
MCH: 32.9 pg (ref 27.0–33.0)
MCHC: 34.2 g/dL (ref 32.0–36.0)
MPV: 10.1 fL (ref 7.5–12.5)
Platelets: 410 10*3/uL — ABNORMAL HIGH (ref 140–400)
RBC: 3.83 10*6/uL (ref 3.80–5.10)
RDW: 11.9 % (ref 11.0–15.0)
WBC: 14.1 10*3/uL — ABNORMAL HIGH (ref 3.8–10.8)

## 2020-08-13 LAB — CYTOLOGY - PAP
Comment: NEGATIVE
Diagnosis: NEGATIVE
High risk HPV: NEGATIVE

## 2020-08-13 LAB — TSH: TSH: 1.36 mIU/L

## 2020-08-13 NOTE — Progress Notes (Signed)
Elevated WBC Repeat 6-12 weeks, future order placed.

## 2020-08-14 ENCOUNTER — Encounter: Payer: Self-pay | Admitting: Nurse Practitioner

## 2020-08-26 ENCOUNTER — Other Ambulatory Visit: Payer: 59 | Admitting: Obstetrics & Gynecology

## 2020-08-26 ENCOUNTER — Other Ambulatory Visit: Payer: 59

## 2022-04-26 HISTORY — PX: FOOT SURGERY: SHX648

## 2022-06-01 ENCOUNTER — Encounter: Payer: Self-pay | Admitting: Radiology

## 2022-06-01 ENCOUNTER — Ambulatory Visit (INDEPENDENT_AMBULATORY_CARE_PROVIDER_SITE_OTHER): Payer: 59 | Admitting: Radiology

## 2022-06-01 VITALS — BP 126/80 | Ht 63.0 in | Wt 165.0 lb

## 2022-06-01 DIAGNOSIS — Z01419 Encounter for gynecological examination (general) (routine) without abnormal findings: Secondary | ICD-10-CM | POA: Diagnosis not present

## 2022-06-01 NOTE — Progress Notes (Signed)
   Katelyn Welch 11-Aug-1975 [id number]   History:  47 y.o. G1P1 presents for annual exam. No gyn concerns.   Gynecologic History Patient's last menstrual period was 05/18/2022 (approximate). Period Cycle (Days):  (skipping menses) Period Pattern: (!) Irregular Menstrual Flow: Light, Moderate, Heavy Menstrual Control: Tampon Dysmenorrhea: None Contraception/Family planning: vasectomy Sexually active: yes Last Pap: 2022. Results were: normal Last mammogram: 2022. Results were: normal  Obstetric History OB History  Gravida Para Term Preterm AB Living  '1 1 1 '$ 0 0 1  SAB IAB Ectopic Multiple Live Births  0 0 0 0 1    # Outcome Date GA Lbr Len/2nd Weight Sex Delivery Anes PTL Lv  1 Term     F Vag-Spont   LIV     The following portions of the patient's history were reviewed and updated as appropriate: allergies, current medications, past family history, past medical history, past social history, past surgical history, and problem list.  Review of Systems Pertinent items noted in HPI and remainder of comprehensive ROS otherwise negative.   Past medical history, past surgical history, family history and social history were all reviewed and documented in the EPIC chart.   Exam:  Vitals:   06/01/22 1357  BP: 126/80  Weight: 165 lb (74.8 kg)  Height: '5\' 3"'$  (1.6 m)   Body mass index is 29.23 kg/m.  General appearance:  Normal Thyroid:  Symmetrical, normal in size, without palpable masses or nodularity. Respiratory  Auscultation:  Clear without wheezing or rhonchi Cardiovascular  Auscultation:  Regular rate, without rubs, murmurs or gallops  Edema/varicosities:  Not grossly evident Abdominal  Soft,nontender, without masses, guarding or rebound.  Liver/spleen:  No organomegaly noted  Hernia:  None appreciated  Skin  Inspection:  Grossly normal Breasts: Examined lying and sitting.   Right: Without masses, retractions, nipple discharge or axillary  adenopathy.   Left: Without masses, retractions, nipple discharge or axillary adenopathy. Genitourinary   Inguinal/mons:  Normal without inguinal adenopathy  External genitalia:  Normal appearing vulva with no masses, tenderness, or lesions  BUS/Urethra/Skene's glands:  Normal without masses or exudate  Vagina:  Normal appearing with normal color and discharge, no lesions  Cervix:  Normal appearing without discharge or lesions  Uterus:  Normal in size, shape and contour.  Mobile, nontender  Adnexa/parametria:     Rt: Normal in size, without masses or tenderness.   Lt: Normal in size, without masses or tenderness.  Anus and perineum: Normal   Patient informed chaperone available to be present for breast and pelvic exam. Patient has requested no chaperone to be present. Patient has been advised what will be completed during breast and pelvic exam.   Assessment/Plan:   1. Well woman exam with routine gynecological exam Pap due 2025 Overdue for mammogram. Will schedule herself.     Discussed SBE, colonoscopy and DEXA screening as directed/appropriate. Recommend 16mns of exercise weekly, including weight bearing exercise. Encouraged the use of seatbelts and sunscreen. Return in 1 year for annual or as needed.   CRubbie BattiestB WHNP-BC 2:40 PM 06/01/2022

## 2023-04-12 HISTORY — PX: SHOULDER ARTHROSCOPY W/ BANKART PROCEDURE: SHX2397

## 2023-06-18 ENCOUNTER — Other Ambulatory Visit: Payer: Self-pay

## 2023-06-18 ENCOUNTER — Encounter: Payer: Self-pay | Admitting: Physical Therapy

## 2023-06-18 ENCOUNTER — Ambulatory Visit: Attending: Sports Medicine | Admitting: Physical Therapy

## 2023-06-18 DIAGNOSIS — M25511 Pain in right shoulder: Secondary | ICD-10-CM | POA: Diagnosis present

## 2023-06-18 DIAGNOSIS — M62838 Other muscle spasm: Secondary | ICD-10-CM | POA: Diagnosis present

## 2023-06-18 DIAGNOSIS — M25611 Stiffness of right shoulder, not elsewhere classified: Secondary | ICD-10-CM | POA: Insufficient documentation

## 2023-06-18 NOTE — Therapy (Signed)
 OUTPATIENT PHYSICAL THERAPY SHOULDER EVALUATION   Patient Name: Katelyn Welch MRN: [id number] DOB:[birth year], 48 y.o., female Today's Date: 06/18/2023  END OF SESSION:  PT End of Session - 06/18/23 1428     Visit Number 1    Number of Visits 12    Date for PT Re-Evaluation 07/30/23    PT Start Time 0100    PT Stop Time 0149    PT Time Calculation (min) 49 min    Activity Tolerance Patient tolerated treatment well    Behavior During Therapy WFL for tasks assessed/performed             Past Medical History:  Diagnosis Date   Abnormal Pap smear of cervix    B12 deficiency    GERD (gastroesophageal reflux disease)    OTC as needed   Hypertension    IBS (irritable bowel syndrome)    Migraine without aura    Other and unspecified disc disorder of lumbar region    Shingles    Past Surgical History:  Procedure Laterality Date   FOOT SURGERY Right 04/15/2013   FOOT SURGERY Right 04/26/2022   HARDWARE REMOVAL  04/20/2011   Procedure: HARDWARE REMOVAL;  Surgeon: [name]., MD;  Location: [facility];  Service: Orthopedics;  Laterality: Left;  removal of plate and screws left ulna   OSTEOTOMY AND ULNAR SHORTENING  04/28/2008   left   There are no active problems to display for this patient.   REFERRING PROVIDER: Elinor Dodge MD  REFERRING DIAG: S/p ORIF of right ACJ.  THERAPY DIAG:  Acute pain of right shoulder  Stiffness of right shoulder, not elsewhere classified  Other muscle spasm  Rationale for Evaluation and Treatment: Rehabilitation  ONSET DATE: 03/30/23.  SUBJECTIVE:                                                                                                                                                                                      SUBJECTIVE STATEMENT: The patient presents to the clinic having sustained a right clavicular fracture due to a dog related fall on 03/30/23.  She underwent an ORIF on 04/12/23.  She has  been working some on her range of motion by actively elevating her right shoulder and in supine she puts her palm behind her head to stretch her shoulder..  Her pain rating is a 7/10 and can rise to higher levels when moving her right shoulder.  She has not found anything really decreases her pain.    PERTINENT HISTORY: See above.    PAIN:  Are you having pain? Yes: NPRS scale: 7/10. Pain location: Right ACJ region.  Pain description: Ache, sore, throbbing, sharp, numb and shooting.  Aggravating factors: Attempting to move above head and "extending." Relieving factors: "Nothing."  PRECAUTIONS: Per MD note on 06/12/23:  Advised to avoid heavy lifting and strenuous activities.   RED FLAGS: None   WEIGHT BEARING RESTRICTIONS:  No right UE weight bearing.    FALLS:  Has patient fallen in last 6 months? Yes. Number of falls As above.  LIVING ENVIRONMENT: Lives with: lives with their spouse Lives in: House/apartment Has following equipment at home: None  OCCUPATION: [employer].  Currently out of work due to injury.  PLOF: Independent  PATIENT GOALS:Use right arm like before injury.    NEXT MD VISIT:   OBJECTIVE:  Note: Objective measures were completed at Evaluation unless otherwise noted.  DIAGNOSTIC FINDINGS:  Imaging Results - XR Clavicle Right (06/06/2023 1:15 PM EDT) Impressions  06/12/2023 9:15 PM EDT  X-RAYS: 2 AP views of the RIGHT clavicle.  The images were obtained and reviewed in the office today. INTERPRETATION: Postsurgical changes.  AC joint in stable alignment.   Internal fixation intact.     PATIENT SURVEYS:  Quick Dash 75%.   UPPER EXTREMITY ROM:   In supine:  Gentle P/AROM into right shoulder flexion to 70 degrees and ER to 10 degrees.  UPPER EXTREMITY MMT  NT   PALPATION:  Tender to palpation over right middle deltoid region.                                                                                                                                TREATMENT DATE: 06/18/23:  Instruct in HEP.  HMP and IFC at 80-150 Hx on 40% scan x 20 minutes to patient's left middle deltoid region.  Normal modality response following removal of modality.   PATIENT EDUCATION: Education details: See below.  Gentle supine cane exercises to improve flexion and external rotation. Person educated: Patient Education method: Explanation, Demonstration, Tactile cues, Verbal cues, and Handouts Education comprehension: verbalized understanding and returned demonstration  HOME EXERCISE PROGRAM: HOME EXERCISE PROGRAM Created by [place] Garin Mata Mar 24th, 2025 View at [email or web address] using code [id number]  HOME EXERCISE PROGRAM [[id number]]  WAND FLEXION  - SUPINE -  Repeat 15 Repetitions, Hold 5 Seconds, Complete 2 Sets, Perform 4 Times a Day  WAND EXTERNAL ROTATION - SUPINE ER -  Repeat 10 Repetitions, Hold 15 Seconds, Complete 2 Sets, Perform 4 Times a Day  ASSESSMENT:  CLINICAL IMPRESSION: The patient presents to OPPT s/p right ACJ repair on 04/12/23.  She reports a high pain-level.  She is now out of her sling and has been moving her shoulder actively for range of motion.  She currently lack range of motion.  She reports palpable tenderness over her middle deltoid region.  Her Quck DASH is 75%.  Patient will benefit from skilled physical therapy intervention to address pain and deficits.  OBJECTIVE IMPAIRMENTS: decreased activity tolerance, decreased ROM, increased muscle spasms, and pain.  ACTIVITY LIMITATIONS: carrying, lifting, and reach over head  PARTICIPATION LIMITATIONS: meal prep, cleaning, and laundry  REHAB POTENTIAL: Good  CLINICAL DECISION MAKING: Stable/uncomplicated  EVALUATION COMPLEXITY: Low   GOALS: LONG TERM GOALS: Target date: 07/30/23.  Ind with a HEP.  Goal status: INITIAL  2.  Active right  shoulder flexion to 145 degrees so the patient can easily reach overhead.  Goal status: INITIAL  3.  Active ER to 70  degrees+ to allow for easily donning/doffing of apparel.  Goal status: INITIAL  4.  Perform ADL's with right shoulder pain not > 3/10.  Goal status: INITIAL  PLAN:  PT FREQUENCY: 2x/week  PT DURATION: 6 weeks  PLANNED INTERVENTIONS: 97110-Therapeutic exercises, 97530- Therapeutic activity, [id number]- Neuromuscular re-education, 97535- Self Care, 95621- Manual therapy, G0283- Electrical stimulation (unattended), 97035- Ultrasound, Patient/Family education, Cryotherapy, and Moist heat  PLAN FOR NEXT SESSION: Please review HEP, modalities and STW/M as needed to right middle deltoid region.  Table slides, seated UE Ranger.     Demeshia Sherburne, [place], PT 06/18/2023, 4:13 PM

## 2023-06-22 ENCOUNTER — Ambulatory Visit

## 2023-06-22 DIAGNOSIS — M25511 Pain in right shoulder: Secondary | ICD-10-CM

## 2023-06-22 DIAGNOSIS — M25611 Stiffness of right shoulder, not elsewhere classified: Secondary | ICD-10-CM

## 2023-06-22 DIAGNOSIS — M62838 Other muscle spasm: Secondary | ICD-10-CM

## 2023-06-22 NOTE — Therapy (Signed)
 OUTPATIENT PHYSICAL THERAPY SHOULDER TREATMENT   Patient Name: Katelyn Welch MRN: [id number] DOB:1975-07-29, 48 y.o., female Today's Date: 06/22/2023  END OF SESSION:  PT End of Session - 06/22/23 1020     Visit Number 2    Number of Visits 12    Date for PT Re-Evaluation 07/30/23    PT Start Time 1015    PT Stop Time 1116    PT Time Calculation (min) 61 min    Activity Tolerance Patient tolerated treatment well    Behavior During Therapy WFL for tasks assessed/performed             Past Medical History:  Diagnosis Date   Abnormal Pap smear of cervix    B12 deficiency    GERD (gastroesophageal reflux disease)    OTC as needed   Hypertension    IBS (irritable bowel syndrome)    Migraine without aura    Other and unspecified disc disorder of lumbar region    Shingles    Past Surgical History:  Procedure Laterality Date   FOOT SURGERY Right 04/15/2013   FOOT SURGERY Right 04/26/2022   HARDWARE REMOVAL  04/20/2011   Procedure: HARDWARE REMOVAL;  Surgeon: [name]., MD;  Location: [facility];  Service: Orthopedics;  Laterality: Left;  removal of plate and screws left ulna   OSTEOTOMY AND ULNAR SHORTENING  04/28/2008   left   There are no active problems to display for this patient.   REFERRING PROVIDER: Elinor Dodge MD  REFERRING DIAG: S/p ORIF of right ACJ.  THERAPY DIAG:  Acute pain of right shoulder  Stiffness of right shoulder, not elsewhere classified  Other muscle spasm  Rationale for Evaluation and Treatment: Rehabilitation  ONSET DATE: 03/30/23.  SUBJECTIVE:                                                                                                                                                                                      SUBJECTIVE STATEMENT: Pt reports 7/10 right AC joint pain today.   PERTINENT HISTORY: See above.    PAIN:  Are you having pain? Yes: NPRS scale: 7/10. Pain location: Right ACJ  region.   Pain description: Ache, sore, throbbing, sharp, numb and shooting.  Aggravating factors: Attempting to move above head and "extending." Relieving factors: "Nothing."  PRECAUTIONS: Per MD note on 06/12/23:  Advised to avoid heavy lifting and strenuous activities.   RED FLAGS: None   WEIGHT BEARING RESTRICTIONS:  No right UE weight bearing.    FALLS:  Has patient fallen in last 6 months? Yes. Number of falls As above.  LIVING ENVIRONMENT: Lives with: lives with  their spouse Lives in: House/apartment Has following equipment at home: None  OCCUPATION: [employer].  Currently out of work due to injury.  PLOF: Independent  PATIENT GOALS:Use right arm like before injury.    NEXT MD VISIT:   OBJECTIVE:  Note: Objective measures were completed at Evaluation unless otherwise noted.  DIAGNOSTIC FINDINGS:  Imaging Results - XR Clavicle Right (06/06/2023 1:15 PM EDT) Impressions  06/12/2023 9:15 PM EDT  X-RAYS: 2 AP views of the RIGHT clavicle.  The images were obtained and reviewed in the office today. INTERPRETATION: Postsurgical changes.  AC joint in stable alignment.   Internal fixation intact.     PATIENT SURVEYS:  Quick Dash 75%.   UPPER EXTREMITY ROM:   In supine:  Gentle P/AROM into right shoulder flexion to 70 degrees and ER to 10 degrees.  UPPER EXTREMITY MMT  NT   PALPATION:  Tender to palpation over right middle deltoid region.                                                                                                                               TREATMENT DATE:    06/22/23                          EXERCISE LOG  Exercise Repetitions and Resistance Comments  Pulley 5 mins   Ranger Flex/ext; CW and CCW circles x 2.5 mins each   Bicep Curls - 3 way 2# x 20 reps each    Blank cell = exercise not performed today   Manual Therapy Soft Tissue Mobilization: right shoulder, STW/M to right bicep and deltoid to decrease pain and tone.     Modalities  Date:  Unattended Estim: Shoulder, IFC 80-150 Hz, 15 mins, Pain and Tone Hot Pack: Shoulder, 15 mins, Pain and Tone   06/18/23:  Instruct in HEP.  HMP and IFC at 80-150 Hx on 40% scan x 20 minutes to patient's left middle deltoid region.  Normal modality response following removal of modality.   PATIENT EDUCATION: Education details: See below.  Gentle supine cane exercises to improve flexion and external rotation. Person educated: Patient Education method: Explanation, Demonstration, Tactile cues, Verbal cues, and Handouts Education comprehension: verbalized understanding and returned demonstration  HOME EXERCISE PROGRAM: HOME EXERCISE PROGRAM Created by [place] Applegate Mar 24th, 2025 View at [email or web address] using code [email or web address]  HOME EXERCISE PROGRAM [[id number]]  WAND FLEXION  - SUPINE -  Repeat 15 Repetitions, Hold 5 Seconds, Complete 2 Sets, Perform 4 Times a Day  WAND EXTERNAL ROTATION - SUPINE ER -  Repeat 10 Repetitions, Hold 15 Seconds, Complete 2 Sets, Perform 4 Times a Day  ASSESSMENT:  CLINICAL IMPRESSION: Pt arrives for today's treatment session reporting 7/10 right shoulder pain.  Pt introduced to pulleys and UE ranger today to increase right shoulder ROM and function.  Pt requiring cues to remain within pain free ROM.  Pt also introduced to 3-way  bicep curls with good results.  STW/M performed to right bicep and deltoid to decrease pain and tone.  Normal responses to estim and MH noted upon removal.  Pt reported 2/10 right shoulder pain at completion of today's treatment session.  OBJECTIVE IMPAIRMENTS: decreased activity tolerance, decreased ROM, increased muscle spasms, and pain.   ACTIVITY LIMITATIONS: carrying, lifting, and reach over head  PARTICIPATION LIMITATIONS: meal prep, cleaning, and laundry  REHAB POTENTIAL: Good  CLINICAL DECISION MAKING: Stable/uncomplicated  EVALUATION COMPLEXITY: Low   GOALS: LONG TERM GOALS: Target date:  07/30/23.  Ind with a HEP.  Goal status: INITIAL  2.  Active right  shoulder flexion to 145 degrees so the patient can easily reach overhead.  Goal status: INITIAL  3.  Active ER to 70 degrees+ to allow for easily donning/doffing of apparel.  Goal status: INITIAL  4.  Perform ADL's with right shoulder pain not > 3/10.  Goal status: INITIAL  PLAN:  PT FREQUENCY: 2x/week  PT DURATION: 6 weeks  PLANNED INTERVENTIONS: 97110-Therapeutic exercises, 97530- Therapeutic activity, [id number]- Neuromuscular re-education, 97535- Self Care, 16109- Manual therapy, G0283- Electrical stimulation (unattended), 97035- Ultrasound, Patient/Family education, Cryotherapy, and Moist heat  PLAN FOR NEXT SESSION: Please review HEP, modalities and STW/M as needed to right middle deltoid region.  Table slides, seated UE Ranger.    Newman Pies, PTA 06/22/2023, 11:38 AM

## 2023-06-27 ENCOUNTER — Ambulatory Visit: Attending: Sports Medicine

## 2023-06-27 DIAGNOSIS — M62838 Other muscle spasm: Secondary | ICD-10-CM | POA: Insufficient documentation

## 2023-06-27 DIAGNOSIS — M25611 Stiffness of right shoulder, not elsewhere classified: Secondary | ICD-10-CM | POA: Diagnosis present

## 2023-06-27 DIAGNOSIS — M25511 Pain in right shoulder: Secondary | ICD-10-CM | POA: Insufficient documentation

## 2023-06-27 NOTE — Therapy (Signed)
 OUTPATIENT PHYSICAL THERAPY SHOULDER TREATMENT   Patient Name: Katelyn Welch MRN: [id number] DOB:16-Jul-1975, 48 y.o., female Today's Date: 06/27/2023  END OF SESSION:  PT End of Session - 06/27/23 1512     Visit Number 3    Number of Visits 12    Date for PT Re-Evaluation 07/30/23    PT Start Time 1513    PT Stop Time 1612    PT Time Calculation (min) 59 min    Activity Tolerance Patient tolerated treatment well;Patient limited by pain    Behavior During Therapy WFL for tasks assessed/performed             Past Medical History:  Diagnosis Date   Abnormal Pap smear of cervix    B12 deficiency    GERD (gastroesophageal reflux disease)    OTC as needed   Hypertension    IBS (irritable bowel syndrome)    Migraine without aura    Other and unspecified disc disorder of lumbar region    Shingles    Past Surgical History:  Procedure Laterality Date   FOOT SURGERY Right 04/15/2013   FOOT SURGERY Right 04/26/2022   HARDWARE REMOVAL  04/20/2011   Procedure: HARDWARE REMOVAL;  Surgeon: [name]., MD;  Location: [facility];  Service: Orthopedics;  Laterality: Left;  removal of plate and screws left ulna   OSTEOTOMY AND ULNAR SHORTENING  04/28/2008   left   There are no active problems to display for this patient.   REFERRING PROVIDER: Elinor Dodge MD  REFERRING DIAG: S/p ORIF of right ACJ.  THERAPY DIAG:  Acute pain of right shoulder  Stiffness of right shoulder, not elsewhere classified  Other muscle spasm  Rationale for Evaluation and Treatment: Rehabilitation  ONSET DATE: 03/30/23.  SUBJECTIVE:                                                                                                                                                                                      SUBJECTIVE STATEMENT:  Pt reported 9/10 pain at shoulder and elbow at the beginning of treatment. She had throbbing pain at the shoulder and sharp pain at the  elbow. She went back to work this Monday and thought that is what caused the increased pain.   PERTINENT HISTORY: See above.    PAIN:  Are you having pain? Yes: NPRS scale: 9/10. Pain location: Right ACJ region.   Pain description: Ache, sore, throbbing, sharp, numb and shooting.  Aggravating factors: Attempting to move above head and "extending." Relieving factors: "Nothing."  PRECAUTIONS: Per MD note on 06/12/23:  Advised to avoid heavy lifting and strenuous activities.   RED  FLAGS: None   WEIGHT BEARING RESTRICTIONS:  No right UE weight bearing.    FALLS:  Has patient fallen in last 6 months? Yes. Number of falls As above.  LIVING ENVIRONMENT: Lives with: lives with their spouse Lives in: House/apartment Has following equipment at home: None  OCCUPATION: [employer].  Currently out of work due to injury.  PLOF: Independent  PATIENT GOALS:Use right arm like before injury.    NEXT MD VISIT:   OBJECTIVE:  Note: Objective measures were completed at Evaluation unless otherwise noted.  DIAGNOSTIC FINDINGS:  Imaging Results - XR Clavicle Right (06/06/2023 1:15 PM EDT) Impressions  06/12/2023 9:15 PM EDT  X-RAYS: 2 AP views of the RIGHT clavicle.  The images were obtained and reviewed in the office today. INTERPRETATION: Postsurgical changes.  AC joint in stable alignment.   Internal fixation intact.     PATIENT SURVEYS:  Quick Dash 75%.   UPPER EXTREMITY ROM:   In supine:  Gentle P/AROM into right shoulder flexion to 70 degrees and ER to 10 degrees.  UPPER EXTREMITY MMT  NT   PALPATION:  Tender to palpation over right middle deltoid region.                                                                                                                               TREATMENT DATE:                                    06/27/23  EXERCISE LOG   Exercise Repetitions and Resistance Comments  Pulleys   4 minutes   Stopped due to shoulder pain   Ranger   5  minutes     Bicep Curls   3 sets of 12 reps    AAROM ER and IR  3 sets of 30 seconds both sides    Shoulder IR in standing  3 sets 12 reps             Blank cell = exercise not performed today   Manual therapy:   Soft Tissue Mobilizations at right upper trap, deltoid, LHBT, ECRB, and ECRL to decrease pain and soreness. Patient reported decrease pain, increased mobility and no adverse reactions to the treatment.   Modalities:    Date: 06/27/23 Unattended Estim: Shoulder and dorsal aspect of proximal forearm, IFC 80-150 Hz, 15 mins, Pain and Tone Hot Pack: Shoulder, 15 mins, Pain and Tone   Upon completion, patient reported no adverse reactions to the treatment, decreased pain and soreness.      06/22/23                          EXERCISE LOG  Exercise Repetitions and Resistance Comments  Pulley 5 mins   Ranger Flex/ext; CW and CCW circles x 2.5 mins each   Bicep Curls - 3 way 2# x  20 reps each    Blank cell = exercise not performed today   Manual Therapy Soft Tissue Mobilization: right shoulder, STW/M to right bicep and deltoid to decrease pain and tone.    Modalities  Date:  Unattended Estim: Shoulder, IFC 80-150 Hz, 15 mins, Pain and Tone Hot Pack: Shoulder, 15 mins, Pain and Tone   06/18/23:  Instruct in HEP.  HMP and IFC at 80-150 Hx on 40% scan x 20 minutes to patient's left middle deltoid region.  Normal modality response following removal of modality.   PATIENT EDUCATION: Education details: See below.  Gentle supine cane exercises to improve flexion and external rotation. Person educated: Patient Education method: Explanation, Demonstration, Tactile cues, Verbal cues, and Handouts Education comprehension: verbalized understanding and returned demonstration  HOME EXERCISE PROGRAM: HOME EXERCISE PROGRAM Created by [place] Applegate Mar 24th, 2025 View at [email or web address] using code [email or web address]  HOME EXERCISE PROGRAM [[id number]]  WAND FLEXION  - SUPINE -  Repeat  15 Repetitions, Hold 5 Seconds, Complete 2 Sets, Perform 4 Times a Day  WAND EXTERNAL ROTATION - SUPINE ER -  Repeat 10 Repetitions, Hold 15 Seconds, Complete 2 Sets, Perform 4 Times a Day  ASSESSMENT:  CLINICAL IMPRESSION:   Patient's pain and soreness limited ability to progress exercises today but was able to tolerate treatment well. She had difficulty and pain performing the pulleys. Patient was provided verbal and tactile cueing for bicep curls, shoulder IR, and AAROM for IR and ER in order to assure proper muscle engagement and body mechanics. She was provided STW/M, e-stim and moist heat to right bicep, deltoid, ECRB and ECRL to decrease pain and tone. At the end of treatment she reported her pain had gone from a 9/10 to a 5/10 along with increased mobility. Patient will benefit from skilled PT in order address impairments and decrease pain while improving ability perform work related tasks.    OBJECTIVE IMPAIRMENTS: decreased activity tolerance, decreased ROM, increased muscle spasms, and pain.   ACTIVITY LIMITATIONS: carrying, lifting, and reach over head  PARTICIPATION LIMITATIONS: meal prep, cleaning, and laundry  REHAB POTENTIAL: Good  CLINICAL DECISION MAKING: Stable/uncomplicated  EVALUATION COMPLEXITY: Low   GOALS: LONG TERM GOALS: Target date: 07/30/23.  Ind with a HEP.  Goal status: MET    2.  Active right  shoulder flexion to 145 degrees so the patient can easily reach overhead.  Goal status: INITIAL  3.  Active ER to 70 degrees+ to allow for easily donning/doffing of apparel.  Goal status: INITIAL  4.  Perform ADL's with right shoulder pain not > 3/10.  Goal status: INITIAL  PLAN:  PT FREQUENCY: 2x/week  PT DURATION: 6 weeks  PLANNED INTERVENTIONS: 97110-Therapeutic exercises, 97530- Therapeutic activity, [id number]- Neuromuscular re-education, 97535- Self Care, 21308- Manual therapy, G0283- Electrical stimulation (unattended), 97035- Ultrasound,  Patient/Family education, Cryotherapy, and Moist heat  PLAN FOR NEXT SESSION:  Continue shoulder and elbow muscle functional strength training exercises, progress shoulder mobility exercises, and provide STW/M and modalities as needed.    Caitrin Pendergraph, Student-PT 06/27/2023, 4:54 PM

## 2023-06-29 ENCOUNTER — Ambulatory Visit: Admitting: Physical Therapy

## 2023-06-29 DIAGNOSIS — M25511 Pain in right shoulder: Secondary | ICD-10-CM | POA: Diagnosis not present

## 2023-06-29 DIAGNOSIS — M25611 Stiffness of right shoulder, not elsewhere classified: Secondary | ICD-10-CM

## 2023-06-29 DIAGNOSIS — M62838 Other muscle spasm: Secondary | ICD-10-CM

## 2023-06-29 NOTE — Therapy (Signed)
 OUTPATIENT PHYSICAL THERAPY SHOULDER TREATMENT   Patient Name: Katelyn Welch MRN: [id number] DOB:May 22, 1975, 48 y.o., female Today's Date: 06/29/2023  END OF SESSION:  PT End of Session - 06/29/23 0924     Visit Number 4    Number of Visits 12    Date for PT Re-Evaluation 07/30/23    PT Start Time 0800    PT Stop Time 0852    PT Time Calculation (min) 52 min    Activity Tolerance Patient tolerated treatment well;Patient limited by pain    Behavior During Therapy [facility] for tasks assessed/performed             Past Medical History:  Diagnosis Date   Abnormal Pap smear of cervix    B12 deficiency    GERD (gastroesophageal reflux disease)    OTC as needed   Hypertension    IBS (irritable bowel syndrome)    Migraine without aura    Other and unspecified disc disorder of lumbar region    Shingles    Past Surgical History:  Procedure Laterality Date   FOOT SURGERY Right 04/15/2013   FOOT SURGERY Right 04/26/2022   HARDWARE REMOVAL  04/20/2011   Procedure: HARDWARE REMOVAL;  Surgeon: [name]., MD;  Location: [facility];  Service: Orthopedics;  Laterality: Left;  removal of plate and screws left ulna   OSTEOTOMY AND ULNAR SHORTENING  04/28/2008   left   There are no active problems to display for this patient.   REFERRING PROVIDER: Elinor Dodge MD  REFERRING DIAG: S/p ORIF of right ACJ.  THERAPY DIAG:  Acute pain of right shoulder  Stiffness of right shoulder, not elsewhere classified  Other muscle spasm  Rationale for Evaluation and Treatment: Rehabilitation  ONSET DATE: 03/30/23.  SUBJECTIVE:                                                                                                                                                                                      SUBJECTIVE STATEMENT:  Pain remains high  PERTINENT HISTORY: See above.    PAIN:  Are you having pain? Yes: NPRS scale: 9/10. Pain location: Right ACJ  region.   Pain description: Ache, sore, throbbing, sharp, numb and shooting.  Aggravating factors: Attempting to move above head and "extending." Relieving factors: "Nothing."  PRECAUTIONS: Per MD note on 06/12/23:  Advised to avoid heavy lifting and strenuous activities.   RED FLAGS: None   WEIGHT BEARING RESTRICTIONS:  No right UE weight bearing.    FALLS:  Has patient fallen in last 6 months? Yes. Number of falls As above.  LIVING ENVIRONMENT: Lives with: lives with their spouse  Lives in: House/apartment Has following equipment at home: None  OCCUPATION: [employer].  Currently out of work due to injury.  PLOF: Independent  PATIENT GOALS:Use right arm like before injury.    NEXT MD VISIT:   OBJECTIVE:  Note: Objective measures were completed at Evaluation unless otherwise noted.  DIAGNOSTIC FINDINGS:  Imaging Results - XR Clavicle Right (06/06/2023 1:15 PM EDT) Impressions  06/12/2023 9:15 PM EDT  X-RAYS: 2 AP views of the RIGHT clavicle.  The images were obtained and reviewed in the office today. INTERPRETATION: Postsurgical changes.  AC joint in stable alignment.   Internal fixation intact.     PATIENT SURVEYS:  Quick Dash 75%.   UPPER EXTREMITY ROM:   In supine:  Gentle P/AROM into right shoulder flexion to 70 degrees and ER to 10 degrees.  06/29/23:  Flexion to 130 degrees and Er to 45 degrees.  UPPER EXTREMITY MMT  NT   PALPATION:  Tender to palpation over right middle deltoid region.                                                                                                                               TREATMENT DATE:   06/29/23:                                     EXERCISE LOG  Exercise Repetitions and Resistance Comments  Pulleys 5 minutes                   In supine:  Gentle right shoulder passive range of motion into flexion and ER x 18 minutes f/b  HMP and IFC at 80-150 Hz on 40% scan x 20 minutes.  Normal modality response  following removal of modality.                                    06/27/23  EXERCISE LOG   Exercise Repetitions and Resistance Comments  Pulleys   4 minutes   Stopped due to shoulder pain   Ranger   5 minutes     Bicep Curls   3 sets of 12 reps    AAROM ER and IR  3 sets of 30 seconds both sides    Shoulder IR in standing  3 sets 12 reps             Blank cell = exercise not performed today   Manual therapy:   Soft Tissue Mobilizations at right upper trap, deltoid, LHBT, ECRB, and ECRL to decrease pain and soreness. Patient reported decrease pain, increased mobility and no adverse reactions to the treatment.   Modalities:    Date: 06/27/23 Unattended Estim: Shoulder and dorsal aspect of proximal forearm, IFC 80-150 Hz, 15 mins, Pain and Tone Hot Pack: Shoulder, 15 mins, Pain and Tone  Upon completion, patient reported no adverse reactions to the treatment, decreased pain and soreness.      06/22/23                          EXERCISE LOG  Exercise Repetitions and Resistance Comments  Pulley 5 mins   Ranger Flex/ext; CW and CCW circles x 2.5 mins each   Bicep Curls - 3 way 2# x 20 reps each    Blank cell = exercise not performed today   Manual Therapy Soft Tissue Mobilization: right shoulder, STW/M to right bicep and deltoid to decrease pain and tone.    Modalities  Date:  Unattended Estim: Shoulder, IFC 80-150 Hz, 15 mins, Pain and Tone Hot Pack: Shoulder, 15 mins, Pain and Tone   06/18/23:  Instruct in HEP.  HMP and IFC at 80-150 Hx on 40% scan x 20 minutes to patient's left middle deltoid region.  Normal modality response following removal of modality.   PATIENT EDUCATION: Education details: See below.  Gentle supine cane exercises to improve flexion and external rotation. Person educated: Patient Education method: Explanation, Demonstration, Tactile cues, Verbal cues, and Handouts Education comprehension: verbalized understanding and returned  demonstration  HOME EXERCISE PROGRAM: HOME EXERCISE PROGRAM Created by [place] Delmar Dondero Mar 24th, 2025 View at [email or web address] using code [email or web address]  HOME EXERCISE PROGRAM [[id number]]  WAND FLEXION  - SUPINE -  Repeat 15 Repetitions, Hold 5 Seconds, Complete 2 Sets, Perform 4 Times a Day  WAND EXTERNAL ROTATION - SUPINE ER -  Repeat 10 Repetitions, Hold 15 Seconds, Complete 2 Sets, Perform 4 Times a Day  ASSESSMENT:  CLINICAL IMPRESSION:   Patient with increased pain since returning to work.  She has been compliant to her HEP and her range of motion has improved significantly.    OBJECTIVE IMPAIRMENTS: decreased activity tolerance, decreased ROM, increased muscle spasms, and pain.   ACTIVITY LIMITATIONS: carrying, lifting, and reach over head  PARTICIPATION LIMITATIONS: meal prep, cleaning, and laundry  REHAB POTENTIAL: Good  CLINICAL DECISION MAKING: Stable/uncomplicated  EVALUATION COMPLEXITY: Low   GOALS: LONG TERM GOALS: Target date: 07/30/23.  Ind with a HEP.  Goal status: MET    2.  Active right  shoulder flexion to 145 degrees so the patient can easily reach overhead.  Goal status: INITIAL  3.  Active ER to 70 degrees+ to allow for easily donning/doffing of apparel.  Goal status: INITIAL  4.  Perform ADL's with right shoulder pain not > 3/10.  Goal status: INITIAL  PLAN:  PT FREQUENCY: 2x/week  PT DURATION: 6 weeks  PLANNED INTERVENTIONS: 97110-Therapeutic exercises, 97530- Therapeutic activity, [id number]- Neuromuscular re-education, 97535- Self Care, 52841- Manual therapy, G0283- Electrical stimulation (unattended), 97035- Ultrasound, Patient/Family education, Cryotherapy, and Moist heat  PLAN FOR NEXT SESSION:  Continue shoulder and elbow muscle functional strength training exercises, progress shoulder mobility exercises, and provide STW/M and modalities as needed.    Chanequa Spees, [place], PT 06/29/2023, 10:31 AM

## 2023-07-02 ENCOUNTER — Ambulatory Visit: Admitting: Physical Therapy

## 2023-07-09 ENCOUNTER — Ambulatory Visit: Admitting: Physical Therapy

## 2023-07-09 DIAGNOSIS — M25511 Pain in right shoulder: Secondary | ICD-10-CM

## 2023-07-09 DIAGNOSIS — M25611 Stiffness of right shoulder, not elsewhere classified: Secondary | ICD-10-CM

## 2023-07-09 DIAGNOSIS — M62838 Other muscle spasm: Secondary | ICD-10-CM

## 2023-07-09 NOTE — Therapy (Signed)
 OUTPATIENT PHYSICAL THERAPY SHOULDER TREATMENT   Patient Name: Katelyn Welch MRN: [id number] DOB:03-28-1975, 48 y.o., female Today's Date: 07/09/2023  END OF SESSION:  PT End of Session - 07/09/23 1705     Visit Number 5    Number of Visits 12    Date for PT Re-Evaluation 07/30/23    PT Start Time 0315    PT Stop Time 0414    PT Time Calculation (min) 59 min    Activity Tolerance Patient tolerated treatment well    Behavior During Therapy WFL for tasks assessed/performed              Past Medical History:  Diagnosis Date   Abnormal Pap smear of cervix    B12 deficiency    GERD (gastroesophageal reflux disease)    OTC as needed   Hypertension    IBS (irritable bowel syndrome)    Migraine without aura    Other and unspecified disc disorder of lumbar region    Shingles    Past Surgical History:  Procedure Laterality Date   FOOT SURGERY Right 04/15/2013   FOOT SURGERY Right 04/26/2022   HARDWARE REMOVAL  04/20/2011   Procedure: HARDWARE REMOVAL;  Surgeon: Amelie Baize., MD;  Location: [facility];  Service: Orthopedics;  Laterality: Left;  removal of plate and screws left ulna   OSTEOTOMY AND ULNAR SHORTENING  04/28/2008   left   There are no active problems to display for this patient.   REFERRING PROVIDER: Alda Hummer MD  REFERRING DIAG: S/p ORIF of right ACJ.  THERAPY DIAG:  Acute pain of right shoulder  Stiffness of right shoulder, not elsewhere classified  Other muscle spasm  Rationale for Evaluation and Treatment: Rehabilitation  ONSET DATE: 03/30/23.  SUBJECTIVE:                                                                                                                                                                                      SUBJECTIVE STATEMENT:  Been out of town but did my exercises.   PERTINENT HISTORY: See above.    PAIN:  Are you having pain? Yes: NPRS scale: 4/10. Pain location: Right ACJ  region.   Pain description: Ache, sore, throbbing, sharp, numb and shooting.  Aggravating factors: Attempting to move above head and "extending." Relieving factors: "Nothing."  PRECAUTIONS: Per MD note on 06/12/23:  Advised to avoid heavy lifting and strenuous activities.   RED FLAGS: None   WEIGHT BEARING RESTRICTIONS:  No right UE weight bearing.    FALLS:  Has patient fallen in last 6 months? Yes. Number of falls As above.  LIVING ENVIRONMENT: Lives with:  lives with their spouse Lives in: House/apartment Has following equipment at home: None  OCCUPATION: [employer].  Currently out of work due to injury.  PLOF: Independent  PATIENT GOALS:Use right arm like before injury.    NEXT MD VISIT:   OBJECTIVE:  Note: Objective measures were completed at Evaluation unless otherwise noted.  DIAGNOSTIC FINDINGS:  Imaging Results - XR Clavicle Right (06/06/2023 1:15 PM EDT) Impressions  06/12/2023 9:15 PM EDT  X-RAYS: 2 AP views of the RIGHT clavicle.  The images were obtained and reviewed in the office today. INTERPRETATION: Postsurgical changes.  AC joint in stable alignment.   Internal fixation intact.     PATIENT SURVEYS:  Quick Dash 75%.   UPPER EXTREMITY ROM:   In supine:  Gentle P/AROM into right shoulder flexion to 70 degrees and ER to 10 degrees.  06/29/23:  Flexion to 130 degrees and Er to 45 degrees.  07/09/23:  Flexion 145 degrees and ER to 60 degrees.  UPPER EXTREMITY MMT  NT   PALPATION:  Tender to palpation over right middle deltoid region.                                                                                                                               TREATMENT DATE:    07/09/23:                                     EXERCISE LOG  Exercise Repetitions and Resistance Comments  Pulleys  5 minutes   Wall ladder 5 minutes    Gentle doorway stretch 30 second holds x 4 reps.           In supine:  Gentle right shoulder passive range of  motion into flexion and ER x 20 minutes f/b  HMP and IFC at 80-150 Hz on 40% scan x 20 minutes.  Normal modality response following removal of modality.  06/29/23:                                     EXERCISE LOG  Exercise Repetitions and Resistance Comments  Pulleys 5 minutes                   In supine:  Gentle right shoulder passive range of motion into flexion and ER x 18 minutes f/b  HMP and IFC at 80-150 Hz on 40% scan x 20 minutes.  Normal modality response following removal of modality.                                    06/27/23  EXERCISE LOG   Exercise Repetitions and Resistance Comments  Pulleys   4 minutes   Stopped due to shoulder pain  [place]   5 minutes     Bicep Curls   3 sets of 12 reps    AAROM ER and IR  3 sets of 30 seconds both sides    Shoulder IR in standing  3 sets 12 reps             Blank cell = exercise not performed today   Manual therapy:   Soft Tissue Mobilizations at right upper trap, deltoid, LHBT, ECRB, and ECRL to decrease pain and soreness. Patient reported decrease pain, increased mobility and no adverse reactions to the treatment.   Modalities:    Date: 06/27/23 Unattended Estim: Shoulder and dorsal aspect of proximal forearm, IFC 80-150 Hz, 15 mins, Pain and Tone Hot Pack: Shoulder, 15 mins, Pain and Tone   Upon completion, patient reported no adverse reactions to the treatment, decreased pain and soreness.  PATIENT EDUCATION: Education details: See below.  Gentle supine cane exercises to improve flexion and external rotation. Person educated: Patient Education method: Explanation, Demonstration, Tactile cues, Verbal cues, and Handouts Education comprehension: verbalized understanding and returned demonstration  HOME EXERCISE PROGRAM: HOME EXERCISE PROGRAM Created by [place] Laylaa Guevarra Mar 24th, 2025 View at [email or web address] using code [id number]  HOME EXERCISE PROGRAM [[id number]]  WAND FLEXION  - SUPINE -  Repeat 15  Repetitions, Hold 5 Seconds, Complete 2 Sets, Perform 4 Times a Day  WAND EXTERNAL ROTATION - SUPINE ER -  Repeat 10 Repetitions, Hold 15 Seconds, Complete 2 Sets, Perform 4 Times a Day  ASSESSMENT:  CLINICAL IMPRESSION:   Patient has been out of town but was compliant to her HEP and her range of motion has improved significantly with flexion improved to 145 degrees and ER to 60 degrees.    OBJECTIVE IMPAIRMENTS: decreased activity tolerance, decreased ROM, increased muscle spasms, and pain.   ACTIVITY LIMITATIONS: carrying, lifting, and reach over head  PARTICIPATION LIMITATIONS: meal prep, cleaning, and laundry  REHAB POTENTIAL: Good  CLINICAL DECISION MAKING: Stable/uncomplicated  EVALUATION COMPLEXITY: Low   GOALS: LONG TERM GOALS: Target date: 07/30/23.  Ind with a HEP.  Goal status: MET    2.  Active right  shoulder flexion to 145 degrees so the patient can easily reach overhead.  Goal status: INITIAL  3.  Active ER to 70 degrees+ to allow for easily donning/doffing of apparel.  Goal status: INITIAL  4.  Perform ADL's with right shoulder pain not > 3/10.  Goal status: INITIAL  PLAN:  PT FREQUENCY: 2x/week  PT DURATION: 6 weeks  PLANNED INTERVENTIONS: 97110-Therapeutic exercises, 97530- Therapeutic activity, [id number]- Neuromuscular re-education, 97535- Self Care, 56213- Manual therapy, G0283- Electrical stimulation (unattended), 97035- Ultrasound, Patient/Family education, Cryotherapy, and Moist heat  PLAN FOR NEXT SESSION:  Continue shoulder and elbow muscle functional strength training exercises, progress shoulder mobility exercises, and provide STW/M and modalities as needed.    Husayn Reim, [place], PT 07/09/2023, 5:09 PM

## 2023-07-16 ENCOUNTER — Ambulatory Visit: Admitting: Physical Therapy

## 2023-11-02 ENCOUNTER — Ambulatory Visit: Admitting: Radiology

## 2023-11-02 ENCOUNTER — Encounter: Payer: Self-pay | Admitting: Radiology

## 2023-11-02 VITALS — BP 116/72 | HR 95 | Ht 63.25 in | Wt 144.0 lb

## 2023-11-02 DIAGNOSIS — Z01419 Encounter for gynecological examination (general) (routine) without abnormal findings: Secondary | ICD-10-CM

## 2023-11-02 DIAGNOSIS — Z1331 Encounter for screening for depression: Secondary | ICD-10-CM | POA: Diagnosis not present

## 2023-11-02 DIAGNOSIS — Z9189 Other specified personal risk factors, not elsewhere classified: Secondary | ICD-10-CM

## 2023-11-02 DIAGNOSIS — N920 Excessive and frequent menstruation with regular cycle: Secondary | ICD-10-CM | POA: Diagnosis not present

## 2023-11-02 MED ORDER — NORETHINDRONE 0.35 MG PO TABS: 1.0000 | ORAL_TABLET | Freq: Every day | ORAL | 4 refills | Status: AC

## 2023-11-02 NOTE — Patient Instructions (Signed)
 Preventive Care 16-49 Years Old, Female  Preventive care refers to lifestyle choices and visits with your health care provider that can promote health and wellness. Preventive care visits are also called wellness exams.  What can I expect for my preventive care visit?  Counseling  Your health care provider may ask you questions about your:  Medical history, including:  Past medical problems.  Family medical history.  Pregnancy history.  Current health, including:  Menstrual cycle.  Method of birth control.  Emotional well-being.  Home life and relationship well-being.  Sexual activity and sexual health.  Lifestyle, including:  Alcohol, nicotine or tobacco, and drug use.  Access to firearms.  Diet, exercise, and sleep habits.  Work and work Astronomer.  Sunscreen use.  Safety issues such as seatbelt and bike helmet use.  Physical exam  Your health care provider will check your:  Height and weight. These may be used to calculate your BMI (body mass index). BMI is a measurement that tells if you are at a healthy weight.  Waist circumference. This measures the distance around your waistline. This measurement also tells if you are at a healthy weight and may help predict your risk of certain diseases, such as type 2 diabetes and high blood pressure.  Heart rate and blood pressure.  Body temperature.  Skin for abnormal spots.  What immunizations do I need?    Vaccines are usually given at various ages, according to a schedule. Your health care provider will recommend vaccines for you based on your age, medical history, and lifestyle or other factors, such as travel or where you work.  What tests do I need?  Screening  Your health care provider may recommend screening tests for certain conditions. This may include:  Lipid and cholesterol levels.  Diabetes screening. This is done by checking your blood sugar (glucose) after you have not eaten for a while (fasting).  Pelvic exam and Pap test.  Hepatitis B test.  Hepatitis C  test.  HIV (human immunodeficiency virus) test.  STI (sexually transmitted infection) testing, if you are at risk.  Lung cancer screening.  Colorectal cancer screening.  Mammogram. Talk with your health care provider about when you should start having regular mammograms. This may depend on whether you have a family history of breast cancer.  BRCA-related cancer screening. This may be done if you have a family history of breast, ovarian, tubal, or peritoneal cancers.  Bone density scan. This is done to screen for osteoporosis.  Talk with your health care provider about your test results, treatment options, and if necessary, the need for more tests.  Follow these instructions at home:  Eating and drinking    Eat a diet that includes fresh fruits and vegetables, whole grains, lean protein, and low-fat dairy products.  Take vitamin and mineral supplements as recommended by your health care provider.  Do not drink alcohol if:  Your health care provider tells you not to drink.  You are pregnant, may be pregnant, or are planning to become pregnant.  If you drink alcohol:  Limit how much you have to 0-1 drink a day.  Know how much alcohol is in your drink. In the [place] one drink equals one 12 oz bottle of beer (355 mL), one 5 oz glass of wine (148 mL), or one 1 oz glass of hard liquor (44 mL).  Lifestyle  Brush your teeth every morning and night with fluoride toothpaste. Floss one time each day.  Exercise for at least  30 minutes 5 or more days each week.  Do not use any products that contain nicotine or tobacco. These products include cigarettes, chewing tobacco, and vaping devices, such as e-cigarettes. If you need help quitting, ask your health care provider.  Do not use drugs.  If you are sexually active, practice safe sex. Use a condom or other form of protection to prevent STIs.  If you do not wish to become pregnant, use a form of birth control. If you plan to become pregnant, see your health care provider for a  prepregnancy visit.  Take aspirin only as told by your health care provider. Make sure that you understand how much to take and what form to take. Work with your health care provider to find out whether it is safe and beneficial for you to take aspirin daily.  Find healthy ways to manage stress, such as:  Meditation, yoga, or listening to music.  Journaling.  Talking to a trusted person.  Spending time with friends and family.  Minimize exposure to UV radiation to reduce your risk of skin cancer.  Safety  Always wear your seat belt while driving or riding in a vehicle.  Do not drive:  If you have been drinking alcohol. Do not ride with someone who has been drinking.  When you are tired or distracted.  While texting.  If you have been using any mind-altering substances or drugs.  Wear a helmet and other protective equipment during sports activities.  If you have firearms in your house, make sure you follow all gun safety procedures.  Seek help if you have been physically or sexually abused.  What's next?  Visit your health care provider once a year for an annual wellness visit.  Ask your health care provider how often you should have your eyes and teeth checked.  Stay up to date on all vaccines.  This information is not intended to replace advice given to you by your health care provider. Make sure you discuss any questions you have with your health care provider.  Document Revised: 09/08/2020 Document Reviewed: 09/08/2020  Elsevier Patient Education  2024 [employer].

## 2023-11-02 NOTE — Progress Notes (Signed)
 Katelyn Welch October 04, 1975 [id number]   History:  48 y.o. G1P1 presents for annual exam. C/o periods getting heavier with clots. Patient is a smoker. Multiple fractures over the past 2 years. Dad is living with her after foot amputation.   Gynecologic History Patient's last menstrual period was 10/16/2023 (exact date). Period Cycle (Days): 28 Period Duration (Days): 5-7 Period Pattern: Regular Menstrual Flow: Heavy (passes clots) Menstrual Control: Tampon Dysmenorrhea: (!) Moderate Dysmenorrhea Symptoms: Cramping Contraception/Family planning: vasectomy Sexually active: yes Last Pap: 2022. Results were: normal HPV neg Last mammogram: 10/05/22. Results were: normal  Obstetric History OB History  Gravida Para Term Preterm AB Living  1 1 1  0 0 1  SAB IAB Ectopic Multiple Live Births  0 0 0 0 1    # Outcome Date GA Lbr Len/2nd Weight Sex Type Anes PTL Lv  1 Term     F Vag-Spont   LIV       11/02/2023    7:51 AM  Depression screen PHQ 2/9  Decreased Interest 0  Down, Depressed, Hopeless 0  PHQ - 2 Score 0     The following portions of the patient's history were reviewed and updated as appropriate: allergies, current medications, past family history, past medical history, past social history, past surgical history, and problem list.  Review of Systems  All other systems reviewed and are negative.   Past medical history, past surgical history, family history and social history were all reviewed and documented in the EPIC chart.  Exam:  Vitals:   11/02/23 0750  BP: 116/72  Pulse: 95  SpO2: 99%  Weight: 144 lb (65.3 kg)  Height: 5' 3.25 (1.607 m)   Body mass index is 25.31 kg/m.  Physical Exam Vitals and nursing note reviewed. Exam conducted with a chaperone present.  Constitutional:      Appearance: Normal appearance. She is normal weight.  HENT:     Head: Normocephalic and atraumatic.  Neck:     Thyroid: No thyroid mass, thyromegaly or thyroid tenderness.   Cardiovascular:     Rate and Rhythm: Regular rhythm.     Heart sounds: Normal heart sounds.  Pulmonary:     Effort: Pulmonary effort is normal.     Breath sounds: Normal breath sounds.  Chest:  Breasts:    Breasts are symmetrical.     Right: Normal. No inverted nipple, mass, nipple discharge, skin change or tenderness.     Left: Normal. No inverted nipple, mass, nipple discharge, skin change or tenderness.  Abdominal:     General: Abdomen is flat. Bowel sounds are normal.     Palpations: Abdomen is soft.  Genitourinary:    General: Normal vulva.     Vagina: Normal. No vaginal discharge, bleeding or lesions.     Cervix: Normal. No discharge or lesion.     Uterus: Normal. Not enlarged and not tender.      Adnexa: Right adnexa normal and left adnexa normal.       Right: No mass, tenderness or fullness.         Left: No mass, tenderness or fullness.    Lymphadenopathy:     Upper Body:     Right upper body: No axillary adenopathy.     Left upper body: No axillary adenopathy.  Skin:    General: Skin is warm and dry.  Neurological:     Mental Status: She is alert and oriented to person, place, and time.  Psychiatric:  Mood and Affect: Mood normal.        Thought Content: Thought content normal.        Judgment: Judgment normal.      Darice Hoit, CMA present for exam  Assessment/Plan:   1. Well woman exam with routine gynecological exam (Primary) Pap 2027 Schedule Mammogram  2. Menorrhagia with regular cycle - norethindrone  (MICRONOR ) 0.35 MG tablet; Take 1 tablet (0.35 mg total) by mouth daily.  Dispense: 84 tablet; Refill: 4  3. At high risk for osteoporosis DEXA ordered at [facility]  4. Depression screen     Return in about 1 year (around 11/01/2024) for Annual.  GINETTE SHASTA NOVAK WHNP-BC 8:31 AM 11/02/2023

## 2023-11-13 ENCOUNTER — Other Ambulatory Visit: Payer: Self-pay

## 2023-11-13 DIAGNOSIS — Z01419 Encounter for gynecological examination (general) (routine) without abnormal findings: Secondary | ICD-10-CM

## 2023-11-13 DIAGNOSIS — Z9189 Other specified personal risk factors, not elsewhere classified: Secondary | ICD-10-CM

## 2023-11-13 NOTE — Telephone Encounter (Signed)
 Orders are in. She may need to call the [facility] to schedule.

## 2023-11-17 ENCOUNTER — Other Ambulatory Visit (HOSPITAL_BASED_OUTPATIENT_CLINIC_OR_DEPARTMENT_OTHER): Payer: Self-pay | Admitting: Radiology

## 2023-11-17 DIAGNOSIS — Z1231 Encounter for screening mammogram for malignant neoplasm of breast: Secondary | ICD-10-CM

## 2023-11-23 ENCOUNTER — Encounter (HOSPITAL_BASED_OUTPATIENT_CLINIC_OR_DEPARTMENT_OTHER): Payer: Self-pay | Admitting: Radiology

## 2023-11-23 ENCOUNTER — Ambulatory Visit (HOSPITAL_BASED_OUTPATIENT_CLINIC_OR_DEPARTMENT_OTHER)
Admission: RE | Admit: 2023-11-23 | Discharge: 2023-11-23 | Disposition: A | Discharge status: Home / Self Care | Source: Ambulatory Visit | Attending: Radiology | Admitting: Radiology

## 2023-11-23 DIAGNOSIS — Z1231 Encounter for screening mammogram for malignant neoplasm of breast: Secondary | ICD-10-CM | POA: Insufficient documentation

## 2023-12-20 ENCOUNTER — Encounter

## 2023-12-20 DIAGNOSIS — Z1231 Encounter for screening mammogram for malignant neoplasm of breast: Secondary | ICD-10-CM

## 2024-02-27 ENCOUNTER — Other Ambulatory Visit

## 2024-03-05 ENCOUNTER — Other Ambulatory Visit

## 2024-03-05 DIAGNOSIS — E2839 Other primary ovarian failure: Secondary | ICD-10-CM

## 2024-03-05 DIAGNOSIS — Z9189 Other specified personal risk factors, not elsewhere classified: Secondary | ICD-10-CM

## 2024-03-06 ENCOUNTER — Ambulatory Visit: Payer: Self-pay | Admitting: Nurse Practitioner
